# Patient Record
Sex: Male | Born: 1935 | Race: Black or African American | Hispanic: No | Marital: Married
Health system: Southern US, Community
[De-identification: ages and names within clinical notes are randomized; demographics above are authoritative.]

## PROBLEM LIST (undated history)

## (undated) DIAGNOSIS — K922 Gastrointestinal hemorrhage, unspecified: Secondary | ICD-10-CM

## (undated) DIAGNOSIS — E119 Type 2 diabetes mellitus without complications: Secondary | ICD-10-CM

## (undated) DIAGNOSIS — K219 Gastro-esophageal reflux disease without esophagitis: Secondary | ICD-10-CM

## (undated) DIAGNOSIS — H409 Unspecified glaucoma: Secondary | ICD-10-CM

---

## 2013-04-14 ENCOUNTER — Emergency Department (HOSPITAL_COMMUNITY): Payer: Medicare (Managed Care)

## 2013-04-14 ENCOUNTER — Encounter (HOSPITAL_COMMUNITY): Payer: Self-pay

## 2013-04-14 ENCOUNTER — Emergency Department (HOSPITAL_COMMUNITY)
Admission: EM | Admit: 2013-04-14 | Discharge: 2013-04-14 | Disposition: A | Discharge status: Home / Self Care | Payer: Medicare (Managed Care) | Attending: Emergency Medicine | Admitting: Emergency Medicine

## 2013-04-14 DIAGNOSIS — X500XXA Overexertion from strenuous movement or load, initial encounter: Secondary | ICD-10-CM | POA: Insufficient documentation

## 2013-04-14 DIAGNOSIS — Z23 Encounter for immunization: Secondary | ICD-10-CM | POA: Insufficient documentation

## 2013-04-14 DIAGNOSIS — Y9301 Activity, walking, marching and hiking: Secondary | ICD-10-CM | POA: Insufficient documentation

## 2013-04-14 DIAGNOSIS — IMO0002 Reserved for concepts with insufficient information to code with codable children: Secondary | ICD-10-CM | POA: Insufficient documentation

## 2013-04-14 DIAGNOSIS — S61209A Unspecified open wound of unspecified finger without damage to nail, initial encounter: Secondary | ICD-10-CM | POA: Insufficient documentation

## 2013-04-14 DIAGNOSIS — T148XXA Other injury of unspecified body region, initial encounter: Secondary | ICD-10-CM

## 2013-04-14 DIAGNOSIS — Y929 Unspecified place or not applicable: Secondary | ICD-10-CM | POA: Insufficient documentation

## 2013-04-14 HISTORY — DX: Unspecified glaucoma: H40.9

## 2013-04-14 HISTORY — DX: Type 2 diabetes mellitus without complications: E11.9

## 2013-04-14 LAB — CBC
HCT: 42.9 % (ref 39.0–52.0)
Hemoglobin: 15.2 g/dL (ref 13.0–17.0)
RBC: 4.88 MIL/uL (ref 4.22–5.81)

## 2013-04-14 LAB — COMPREHENSIVE METABOLIC PANEL
ALT: 13 U/L (ref 0–53)
Alkaline Phosphatase: 97 U/L (ref 39–117)
BUN: 30 mg/dL — ABNORMAL HIGH (ref 6–23)
CO2: 21 mEq/L (ref 19–32)
Chloride: 107 mEq/L (ref 96–112)
GFR calc Af Amer: 42 mL/min — ABNORMAL LOW (ref >90)
GFR calc non Af Amer: 36 mL/min — ABNORMAL LOW (ref >90)
Glucose, Bld: 246 mg/dL — ABNORMAL HIGH (ref 70–99)
Potassium: 4.4 mEq/L (ref 3.5–5.1)
Sodium: 141 mEq/L (ref 135–145)
Total Bilirubin: 0.2 mg/dL — ABNORMAL LOW (ref 0.3–1.2)

## 2013-04-14 MED ORDER — DOXYCYCLINE HYCLATE 100 MG PO CAPS: 100.0000 mg | ORAL_CAPSULE | Freq: Two times a day (BID) | ORAL | Status: DC

## 2013-04-14 MED ORDER — TETANUS-DIPHTH-ACELL PERTUSSIS 5-2.5-18.5 LF-MCG/0.5 IM SUSP
0.5000 mL | Freq: Once | INTRAMUSCULAR | Status: AC
  Administered 2013-04-14: 0.5 mL via INTRAMUSCULAR
  Filled 2013-04-14: qty 0.5

## 2013-04-14 NOTE — ED Provider Notes (Signed)
CSN: [id number]     Arrival date & time 04/14/13  1946 History     First MD Initiated Contact with Patient 04/14/13 2040     Chief Complaint  Patient presents with  . Fall   (Consider location/radiation/quality/duration/timing/severity/associated sxs/prior Treatment) HPI Comments: Patient presents to the ER for evaluation of fall. Patient reports that he was walking, lost his balance and fell. He does not think he hit his head. There was no loss of consciousness. Patient denies chest pain, shortness of breath abdominal pain. He does not have any pain. He reports an abrasion on his left knee and a laceration of his left finger. Pain is mild.  Patient is a 79 y.o. male presenting with fall.  Fall    No past medical history on file. No past surgical history on file. No family history on file. History  Substance Use Topics  . Smoking status: Not on file  . Smokeless tobacco: Not on file  . Alcohol Use: Not on file    Review of Systems  Skin: Positive for wound.  All other systems reviewed and are negative.    Allergies  Review of patient's allergies indicates not on file.  Home Medications  No current outpatient prescriptions on file. BP 127/80  Temp(Src) 98.4 F (36.9 C) (Oral)  Resp 18  SpO2 95% Physical Exam  Constitutional: He is oriented to person, place, and time. He appears well-developed and well-nourished. No distress.  HENT:  Head: Normocephalic and atraumatic.  Right Ear: Hearing normal.  Left Ear: Hearing normal.  Nose: Nose normal.  Mouth/Throat: Oropharynx is clear and moist and mucous membranes are normal.  Eyes: Conjunctivae and EOM are normal. Pupils are equal, round, and reactive to light.  Neck: Normal range of motion. Neck supple.  Cardiovascular: Regular rhythm, S1 normal and S2 normal.  Exam reveals no gallop and no friction rub.   No murmur heard. Pulmonary/Chest: Effort normal and breath sounds normal. No respiratory distress. He exhibits no  tenderness.  Abdominal: Soft. Normal appearance and bowel sounds are normal. There is no hepatosplenomegaly. There is no tenderness. There is no rebound, no guarding, no tenderness at McBurney's point and negative Murphy's sign. No hernia.  Musculoskeletal: Normal range of motion.       Right hip: Normal.       Left hip: Normal.       Left knee: He exhibits normal range of motion, no swelling, no effusion, no ecchymosis, no deformity, no laceration and no erythema.       Cervical back: Normal.       Thoracic back: Normal.       Lumbar back: Normal.  Neurological: He is alert and oriented to person, place, and time. He has normal strength. No cranial nerve deficit or sensory deficit. Coordination normal. GCS eye subscore is 4. GCS verbal subscore is 5. GCS motor subscore is 6.  Skin: Skin is warm, dry and intact. No rash noted. No cyanosis.     Psychiatric: He has a normal mood and affect. His speech is normal and behavior is normal. Thought content normal.    ED Course   Procedures (including critical care time)  EKG:  Date: 04/14/2013  Rate: 111  Rhythm: sinus tachycardia and premature atrial contractions (PAC)  QRS Axis: normal  Intervals: normal  ST/T Wave abnormalities: normal  Conduction Disutrbances:right bundle branch block  Narrative Interpretation:   Old EKG Reviewed: none available    Labs Reviewed  COMPREHENSIVE METABOLIC PANEL - Abnormal; Notable  for the following:    Glucose, Bld 246 (*)    BUN 30 (*)    Creatinine, Ser 1.63 (*)    Albumin 3.4 (*)    Total Bilirubin 0.2 (*)    GFR calc non Af Amer 36 (*)    GFR calc Af Amer 42 (*)    All other components within normal limits  CBC  URINALYSIS, ROUTINE W REFLEX MICROSCOPIC   Ct Head Wo Contrast  04/14/2013   *RADIOLOGY REPORT*  Clinical Data: Recent traumatic injury.  CT HEAD WITHOUT CONTRAST  Technique:  Contiguous axial images were obtained from the base of the skull through the vertex without contrast.   Comparison: None.  Findings: The bony calvarium is intact.  Diffuse atrophic changes are identified.  Chronic white matter ischemic change is seen.  No acute hemorrhage, acute infarction or space-occupying mass lesion is seen.  IMPRESSION: Chronic changes without acute abnormality.   Original Report Authenticated By: Alcide Clever, M.D.   Dg Knee Complete 4 Views Left  04/14/2013   *RADIOLOGY REPORT*  Clinical Data: Fall.  Laceration left knee.  LEFT KNEE - COMPLETE 4+ VIEW  Comparison: None.  Findings: No fracture, dislocation or radiopaque foreign body is identified.  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  IMPRESSION: No acute abnormality.   Original Report Authenticated By: Holley Dexter, M.D.   Dg Finger Index Left  04/14/2013   *RADIOLOGY REPORT*  Clinical Data: Status post fall.  Laceration.  LEFT INDEX FINGER 2+V  Comparison: None.  Findings: No fracture or dislocation is identified.  Two punctate radiopaque densities in the volar subcutaneous tissues may be foreign bodies.  IMPRESSION:  1.  Negative for fracture. 2.  Question two punctate foreign bodies in the subcutaneous tissues of the volar index finger.   Original Report Authenticated By: Holley Dexter, M.D.   Diagnoses: 1. Skin avulsion left index finger 2. Abrasion left knee  MDM  Presents to the ER after a fall. Patient has a large avulsion of skin off of the volar aspect of the left index finger. X-ray was negative. He has a small abrasion on his left knee, x-ray was negative. Head CT unremarkable. No concern for neck or back injury based on exam. Patient was mildly tachycardic on arrival. This resolved without intervention. EKG showed sinus rhythm with PACs.    Gilda Crease, MD 04/14/13 [phone]

## 2013-04-14 NOTE — ED Notes (Signed)
Pt was walking and fell. C/o LAC to pointer finger and abrasion on left knee

## 2013-08-30 ENCOUNTER — Emergency Department (HOSPITAL_COMMUNITY)
Admission: EM | Admit: 2013-08-30 | Discharge: 2013-08-30 | Disposition: A | Discharge status: Home / Self Care | Payer: Medicaid - Out of State | Attending: Emergency Medicine | Admitting: Emergency Medicine

## 2013-08-30 ENCOUNTER — Encounter (HOSPITAL_COMMUNITY): Payer: Self-pay | Admitting: Emergency Medicine

## 2013-08-30 ENCOUNTER — Emergency Department (HOSPITAL_COMMUNITY): Payer: Medicaid - Out of State

## 2013-08-30 DIAGNOSIS — R296 Repeated falls: Secondary | ICD-10-CM | POA: Insufficient documentation

## 2013-08-30 DIAGNOSIS — S61214A Laceration without foreign body of right ring finger without damage to nail, initial encounter: Secondary | ICD-10-CM

## 2013-08-30 DIAGNOSIS — Y9301 Activity, walking, marching and hiking: Secondary | ICD-10-CM | POA: Insufficient documentation

## 2013-08-30 DIAGNOSIS — Z87891 Personal history of nicotine dependence: Secondary | ICD-10-CM | POA: Insufficient documentation

## 2013-08-30 DIAGNOSIS — Y9289 Other specified places as the place of occurrence of the external cause: Secondary | ICD-10-CM | POA: Insufficient documentation

## 2013-08-30 DIAGNOSIS — S61209A Unspecified open wound of unspecified finger without damage to nail, initial encounter: Secondary | ICD-10-CM | POA: Insufficient documentation

## 2013-08-30 DIAGNOSIS — Z8669 Personal history of other diseases of the nervous system and sense organs: Secondary | ICD-10-CM | POA: Insufficient documentation

## 2013-08-30 DIAGNOSIS — S6990XA Unspecified injury of unspecified wrist, hand and finger(s), initial encounter: Secondary | ICD-10-CM | POA: Insufficient documentation

## 2013-08-30 DIAGNOSIS — E119 Type 2 diabetes mellitus without complications: Secondary | ICD-10-CM | POA: Insufficient documentation

## 2013-08-30 DIAGNOSIS — S61212A Laceration without foreign body of right middle finger without damage to nail, initial encounter: Secondary | ICD-10-CM

## 2013-08-30 NOTE — ED Notes (Signed)
Patient walked onto the porch by himself and fell off of the porch and was in the bushes upside down. Patient has torn skin right hand. Bleeding controlled.

## 2013-08-30 NOTE — ED Provider Notes (Signed)
CSN: [id number]     Arrival date & time 08/30/13  1409 History   First MD Initiated Contact with Patient 08/30/13 1507     Chief Complaint  Patient presents with  . Fall  . Hand Injury    HPI Patient reports he was walking off a porch and got down to ground level when he lost his balance and fell towards the right landing in a bush.  He reports lacerations to his right index and middle finger.  He denies loss consciousness.  He denies head injury.  No neck pain.  No weakness of his upper lower extremities.  No chest pain or shortness of breath.  No abdominal pain nausea vomiting or diarrhea.  No recent illness.  Overall his normal state of health over the past several days.  No preceding palpitations.  No loss consciousness.  No syncope.  Tetanus updated less than 5 years ago.  Pain in his right hand is mild.  Lacerations over his right middle right index finger present.  Normal flexion.   Past Medical History  Diagnosis Date  . Diabetes mellitus without complication   . Glaucoma    History reviewed. No pertinent past surgical history. History reviewed. No pertinent family history. History  Substance Use Topics  . Smoking status: Former Games developer  . Smokeless tobacco: Never Used  . Alcohol Use: No    Review of Systems  All other systems reviewed and are negative.    Allergies  Review of patient's allergies indicates no known allergies.  Home Medications   Current Outpatient Rx  Name  Route  Sig  Dispense  Refill  . Multiple Vitamins-Minerals (MULTIVITAMIN WITH MINERALS) tablet   Oral   Take 1 tablet by mouth daily.          BP 130/76  Pulse 88  Temp(Src) 98.7 F (37.1 C) (Oral)  Resp 18  Ht 6\' 2"  (1.88 m)  Wt 220 lb (99.791 kg)  BMI 28.23 kg/m2  SpO2 98% Physical Exam  Nursing note and vitals reviewed. Constitutional: He is oriented to person, place, and time. He appears well-developed and well-nourished.  HENT:  Head: Normocephalic and atraumatic.  Eyes: EOM  are normal.  Neck: Normal range of motion.  C-spine nontender.  C-spine cleared by Nexus criteria.  Cardiovascular: Normal rate, regular rhythm, normal heart sounds and intact distal pulses.   Pulmonary/Chest: Effort normal and breath sounds normal. No respiratory distress.  Abdominal: Soft. He exhibits no distension. There is no tenderness.  Musculoskeletal: Normal range of motion.  Full range of motion of bilateral ankles knees and hips.  Full range of motion of bilateral wrists elbows and shoulders.  No thoracic or lumbar tenderness.  Avulsion lacerations overlying the right middle and index fingers.  These are avulsion type lacerations overlying the PIP joints of both of these fingers.  Normal flexion in both his right middle and right index finger.  No active bleeding.  Neurological: He is alert and oriented to person, place, and time.  Skin: Skin is warm and dry.  Psychiatric: He has a normal mood and affect. Judgment normal.    ED Course  Procedures (including critical care time) LACERATION REPAIR Performed by: Lyanne Co Consent: Verbal consent obtained. Risks and benefits: risks, benefits and alternatives were discussed Patient identity confirmed: provided demographic data Time out performed prior to procedure Prepped and Draped in normal sterile fashion Wound explored Laceration Location: Right middle finger, polar surface Laceration Length: 2 cm No Foreign Bodies seen or palpated  Anesthesia: local infiltration Local anesthetic: lidocaine 2 % with epinephrine Anesthetic total: 4 ml Irrigation method: syringe Amount of cleaning: standard Skin closure: 4-0 Prolene  Number of sutures or staples: 5  Technique: Simple interrupted  Patient tolerance: Patient tolerated the procedure well with no immediate complications.  LACERATION REPAIR Performed by: Lyanne Co Consent: Verbal consent obtained. Risks and benefits: risks, benefits and alternatives were  discussed Patient identity confirmed: provided demographic data Time out performed prior to procedure Prepped and Draped in normal sterile fashion Wound explored Laceration Location: Right index finger Laceration Length: 1.5 cm No Foreign Bodies seen or palpated Anesthesia: local infiltration Local anesthetic: lidocaine 2 % with epinephrine Anesthetic total: 3 ml Irrigation method: syringe Amount of cleaning: standard Skin closure: 4-0 Prolene  Number of sutures or staples: 2  Technique: Simple interrupted  Patient tolerance: Patient tolerated the procedure well with no immediate complications.    Labs Review Labs Reviewed - No data to display Imaging Review Dg Hand Complete Right  08/30/2013   CLINICAL DATA:  Fall with lacerations on right 2nd, 3rd, and 4th digits.  EXAM: RIGHT HAND - COMPLETE 3+ VIEW  COMPARISON:  None.  FINDINGS: There is no definite evidence of acute fracture. There is no dislocation. Bones are osteopenic. Mild joint space narrowing and spurring are present at multiple interphalangeal joints, most prominently at the ring finger DIP joint and thumb IP joint. Osteoarthrosis is also noted at the 1st MCP and 1st CMC joints. No radiopaque foreign body is identified.  IMPRESSION: No acute fracture or dislocation identified.  Osteoarthrosis.   Electronically Signed   By: Sebastian Ache   On: 08/30/2013 15:51  I personally reviewed the imaging tests through PACS system I reviewed available ER/hospitalization records through the EMR   EKG Interpretation   None       MDM   1. Laceration of right middle finger w/o foreign body w/o damage to nail, initial encounter   2. Laceration of right ring finger w/o foreign body w/o damage to nail, initial encounter    Lacerations repaired.  C-spine cleared by Nexus criteria.  Full range of motion bilateral hips.  Full range of motion bilateral wrists elbows and shoulders.  Discharge home in good condition.  Infection warnings  given.  No indication for CT head.    Lyanne Co, MD 08/30/13 [phone]

## 2015-03-07 ENCOUNTER — Emergency Department (HOSPITAL_COMMUNITY)
Admission: EM | Admit: 2015-03-07 | Discharge: 2015-03-07 | Disposition: A | Discharge status: Home / Self Care | Payer: Medicare HMO | Attending: Emergency Medicine | Admitting: Emergency Medicine

## 2015-03-07 ENCOUNTER — Emergency Department (HOSPITAL_COMMUNITY): Payer: Medicare HMO

## 2015-03-07 ENCOUNTER — Encounter (HOSPITAL_COMMUNITY): Payer: Self-pay | Admitting: Emergency Medicine

## 2015-03-07 DIAGNOSIS — W06XXXA Fall from bed, initial encounter: Secondary | ICD-10-CM | POA: Diagnosis not present

## 2015-03-07 DIAGNOSIS — Y929 Unspecified place or not applicable: Secondary | ICD-10-CM | POA: Insufficient documentation

## 2015-03-07 DIAGNOSIS — S0181XA Laceration without foreign body of other part of head, initial encounter: Secondary | ICD-10-CM | POA: Diagnosis not present

## 2015-03-07 DIAGNOSIS — Y999 Unspecified external cause status: Secondary | ICD-10-CM | POA: Diagnosis not present

## 2015-03-07 DIAGNOSIS — E119 Type 2 diabetes mellitus without complications: Secondary | ICD-10-CM | POA: Diagnosis not present

## 2015-03-07 DIAGNOSIS — Z23 Encounter for immunization: Secondary | ICD-10-CM | POA: Diagnosis not present

## 2015-03-07 DIAGNOSIS — S0990XA Unspecified injury of head, initial encounter: Secondary | ICD-10-CM | POA: Insufficient documentation

## 2015-03-07 DIAGNOSIS — S0993XA Unspecified injury of face, initial encounter: Secondary | ICD-10-CM | POA: Diagnosis present

## 2015-03-07 DIAGNOSIS — Z79899 Other long term (current) drug therapy: Secondary | ICD-10-CM | POA: Diagnosis not present

## 2015-03-07 DIAGNOSIS — H409 Unspecified glaucoma: Secondary | ICD-10-CM | POA: Insufficient documentation

## 2015-03-07 DIAGNOSIS — Z87891 Personal history of nicotine dependence: Secondary | ICD-10-CM | POA: Diagnosis not present

## 2015-03-07 DIAGNOSIS — Y939 Activity, unspecified: Secondary | ICD-10-CM | POA: Diagnosis not present

## 2015-03-07 MED ORDER — TETANUS-DIPHTH-ACELL PERTUSSIS 5-2.5-18.5 LF-MCG/0.5 IM SUSP
0.5000 mL | Freq: Once | INTRAMUSCULAR | Status: AC
  Administered 2015-03-07: 0.5 mL via INTRAMUSCULAR
  Filled 2015-03-07: qty 0.5

## 2015-03-07 NOTE — ED Notes (Signed)
Awake. Verbally responsive. A/O x4. Resp even and unlabored. No audible adventitious breath sounds noted. ABC's intact. Pt was taken to CT scan and returned without distress noted.

## 2015-03-07 NOTE — ED Notes (Signed)
Awake. Verbally responsive. A/O x4. Resp even and unlabored. No audible adventitious breath sounds noted. ABC's intact.  

## 2015-03-07 NOTE — Discharge Instructions (Signed)
Facial Laceration ° A facial laceration is a cut on the face. These injuries can be painful and cause bleeding. Lacerations usually heal quickly, but they need special care to reduce scarring. °DIAGNOSIS  °Your health care provider will take a medical history, ask for details about how the injury occurred, and examine the wound to determine how deep the cut is. °TREATMENT  °Some facial lacerations may not require closure. Others may not be able to be closed because of an increased risk of infection. The risk of infection and the chance for successful closure will depend on various factors, including the amount of time since the injury occurred. °The wound may be cleaned to help prevent infection. If closure is appropriate, pain medicines may be given if needed. Your health care provider will use stitches (sutures), wound glue (adhesive), or skin adhesive strips to repair the laceration. These tools bring the skin edges together to allow for faster healing and a better cosmetic outcome. If needed, you may also be given a tetanus shot. °HOME CARE INSTRUCTIONS °· Only take over-the-counter or prescription medicines as directed by your health care provider. °· Follow your health care provider's instructions for wound care. These instructions will vary depending on the technique used for closing the wound. °For Sutures: °· Keep the wound clean and dry.   °· If you were given a bandage (dressing), you should change it at least once a day. Also change the dressing if it becomes wet or dirty, or as directed by your health care provider.   °· Wash the wound with soap and water 2 times a day. Rinse the wound off with water to remove all soap. Pat the wound dry with a clean towel.   °· After cleaning, apply a thin layer of the antibiotic ointment recommended by your health care provider. This will help prevent infection and keep the dressing from sticking.   °· You may shower as usual after the first 24 hours. Do not soak the  wound in water until the sutures are removed.   °· Get your sutures removed as directed by your health care provider. With facial lacerations, sutures should usually be taken out after 4-5 days to avoid stitch marks.   °· Wait a few days after your sutures are removed before applying any makeup. °For Skin Adhesive Strips: °· Keep the wound clean and dry.   °· Do not get the skin adhesive strips wet. You may bathe carefully, using caution to keep the wound dry.   °· If the wound gets wet, pat it dry with a clean towel.   °· Skin adhesive strips will fall off on their own. You may trim the strips as the wound heals. Do not remove skin adhesive strips that are still stuck to the wound. They will fall off in time.   °For Wound Adhesive: °· You may briefly wet your wound in the shower or bath. Do not soak or scrub the wound. Do not swim. Avoid periods of heavy sweating until the skin adhesive has fallen off on its own. After showering or bathing, gently pat the wound dry with a clean towel.   °· Do not apply liquid medicine, cream medicine, ointment medicine, or makeup to your wound while the skin adhesive is in place. This may loosen the film before your wound is healed.   °· If a dressing is placed over the wound, be careful not to apply tape directly over the skin adhesive. This may cause the adhesive to be pulled off before the wound is healed.   °· Avoid   prolonged exposure to sunlight or tanning lamps while the skin adhesive is in place. °· The skin adhesive will usually remain in place for 5-10 days, then naturally fall off the skin. Do not pick at the adhesive film.   °After Healing: °Once the wound has healed, cover the wound with sunscreen during the day for 1 full year. This can help minimize scarring. Exposure to ultraviolet light in the first year will darken the scar. It can take 1-2 years for the scar to lose its redness and to heal completely.  °SEEK IMMEDIATE MEDICAL CARE IF: °· You have redness, pain, or  swelling around the wound.   °· You see a yellowish-white fluid (pus) coming from the wound.   °· You have chills or a fever.   °MAKE SURE YOU: °· Understand these instructions. °· Will watch your condition. °· Will get help right away if you are not doing well or get worse. °Document Released: 10/06/2004 Document Revised: 06/19/2013 Document Reviewed: 04/11/2013 °ExitCare® Patient Information ©2015 [employer]. This information is not intended to replace advice given to you by your health care provider. Make sure you discuss any questions you have with your health care provider. °Head Injury °You have received a head injury. It does not appear serious at this time. Headaches and vomiting are common following head injury. It should be easy to awaken from sleeping. Sometimes it is necessary for you to stay in the emergency department for a while for observation. Sometimes admission to the hospital may be needed. After injuries such as yours, most problems occur within the first 24 hours, but side effects may occur up to 7-10 days after the injury. It is important for you to carefully monitor your condition and contact your health care provider or seek immediate medical care if there is a change in your condition. °WHAT ARE THE TYPES OF HEAD INJURIES? °Head injuries can be as minor as a bump. Some head injuries can be more severe. More severe head injuries include: °· A jarring injury to the brain (concussion). °· A bruise of the brain (contusion). This mean there is bleeding in the brain that can cause swelling. °· A cracked skull (skull fracture). °· Bleeding in the brain that collects, clots, and forms a bump (hematoma). °WHAT CAUSES A HEAD INJURY? °A serious head injury is most likely to happen to someone who is in a car wreck and is not wearing a seat belt. Other causes of major head injuries include bicycle or motorcycle accidents, sports injuries, and falls. °HOW ARE HEAD INJURIES DIAGNOSED? °A complete  history of the event leading to the injury and your current symptoms will be helpful in diagnosing head injuries. Many times, pictures of the brain, such as CT or MRI are needed to see the extent of the injury. Often, an overnight hospital stay is necessary for observation.  °WHEN SHOULD I SEEK IMMEDIATE MEDICAL CARE?  °You should get help right away if: °· You have confusion or drowsiness. °· You feel sick to your stomach (nauseous) or have continued, forceful vomiting. °· You have dizziness or unsteadiness that is getting worse. °· You have severe, continued headaches not relieved by medicine. Only take over-the-counter or prescription medicines for pain, fever, or discomfort as directed by your health care provider. °· You do not have normal function of the arms or legs or are unable to walk. °· You notice changes in the black spots in the center of the colored part of your eye (pupil). °· You have a clear or   bloody fluid coming from your nose or ears. °· You have a loss of vision. °During the next 24 hours after the injury, you must stay with someone who can watch you for the warning signs. This person should contact local emergency services (911 in the [place] if you have seizures, you become unconscious, or you are unable to wake up. °HOW CAN I PREVENT A HEAD INJURY IN THE FUTURE? °The most important factor for preventing major head injuries is avoiding motor vehicle accidents.  To minimize the potential for damage to your head, it is crucial to wear seat belts while riding in motor vehicles. Wearing helmets while bike riding and playing collision sports (like football) is also helpful. Also, avoiding dangerous activities around the house will further help reduce your risk of head injury.  °WHEN CAN I RETURN TO NORMAL ACTIVITIES AND ATHLETICS? °You should be reevaluated by your health care provider before returning to these activities. If you have any of the following symptoms, you should not return to  activities or contact sports until 1 week after the symptoms have stopped: °· Persistent headache. °· Dizziness or vertigo. °· Poor attention and concentration. °· Confusion. °· Memory problems. °· Nausea or vomiting. °· Fatigue or tire easily. °· Irritability. °· Intolerant of bright lights or loud noises. °· Anxiety or depression. °· Disturbed sleep. °MAKE SURE YOU:  °· Understand these instructions. °· Will watch your condition. °· Will get help right away if you are not doing well or get worse. °Document Released: 08/29/2005 Document Revised: 09/03/2013 Document Reviewed: 05/06/2013 °ExitCare® Patient Information ©2015 [employer]. This information is not intended to replace advice given to you by your health care provider. Make sure you discuss any questions you have with your health care provider. ° °

## 2015-03-07 NOTE — ED Notes (Signed)
Pt hard of hearing. Family reports they heard him call out at 0500. Family came in to check on pt and found him on the floor beside his bed. Pt had a laceration above R eyebrow. At breakfast at 0730 pt began to have a nose bleed which resolved prior to 1100. Pt states his head hurts. No obvious deformity. Not on blood thinners.

## 2015-03-07 NOTE — ED Notes (Signed)
Granddaughter reported that pt rolled out of bed this morning and hit his head on the floor causing lac to center of fons. (-)LOC, dizziness/lightheadedness or visual disturbances and pt ambulated to another room with steady gait. Family reported that later pt started having bloody nasal drainage. Bleeding controlled at this time. Family reported that when pt blows nose noted nasal bleeding. Denies pt taking blood thinners medication.

## 2015-03-07 NOTE — ED Provider Notes (Signed)
TIME SEEN: 1:30 PM  CHIEF COMPLAINT:  Fall, head injury  HPI: Pt is a 81 y.o. M with history of non-insulin-dependent diabetes, glaucoma who presents the emergency department with his granddaughter after he fell while turning out of bed this morning. She states that he had a small laceration above the right eyebrow. She knows that he did have some intermittent nose bleeding today. This occurred around 5:30 this morning. States he has been eating normally, ambulating normally and acting normally. He is not on anticoagulation. Has no complaints of pain. She states that given his intermittent nose bleeding and she was concerned and brought him to the hospital. Nose is not currently bleeding. She states she is only noticed it when he has been blowing his nose.  ROS: See HPI Constitutional: no fever  Eyes: no drainage  ENT: no runny nose   Cardiovascular:  no chest pain  Resp: no SOB  GI: no vomiting GU: no dysuria Integumentary: no rash  Allergy: no hives  Musculoskeletal: no leg swelling  Neurological: no slurred speech ROS otherwise negative  PAST MEDICAL HISTORY/PAST SURGICAL HISTORY:  Past Medical History  Diagnosis Date  . Diabetes mellitus without complication   . Glaucoma     MEDICATIONS:  Prior to Admission medications   Medication Sig Start Date End Date Taking? Authorizing Provider  Multiple Vitamins-Minerals (MULTIVITAMIN WITH MINERALS) tablet Take 1 tablet by mouth daily.    Historical Provider, MD    ALLERGIES:  No Known Allergies  SOCIAL HISTORY:  History  Substance Use Topics  . Smoking status: Former Games developer  . Smokeless tobacco: Never Used  . Alcohol Use: No    FAMILY HISTORY: History reviewed. No pertinent family history.  EXAM: BP 127/62 mmHg  Pulse 77  Temp(Src) 97.5 F (36.4 C) (Oral)  Resp 16  SpO2 100% CONSTITUTIONAL: Alert and oriented and responds appropriately to questions. Well-appearing; well-nourished; GCS 15, elderly, hard of hearing but  oriented and able to answer questions or follow commands HEAD: Normocephalic; 2 cm superficial laceration above the right eyebrow that does not need repair EYES: Conjunctivae clear, PERRL, EOMI ENT: normal nose; no rhinorrhea; moist mucous membranes; pharynx without lesions noted; no dental injury; no septal hematoma, dried blood in bilateral nostrils with no active bleeding, no blood in the posterior oropharynx NECK: Supple, no meningismus, no LAD; no midline spinal tenderness, step-off or deformity CARD: RRR; S1 and S2 appreciated; no murmurs, no clicks, no rubs, no gallops RESP: Normal chest excursion without splinting or tachypnea; breath sounds clear and equal bilaterally; no wheezes, no rhonchi, no rales; no hypoxia or respiratory distress CHEST:  chest wall stable, no crepitus or ecchymosis or deformity, nontender to palpation ABD/GI: Normal bowel sounds; non-distended; soft, non-tender, no rebound, no guarding PELVIS:  stable, nontender to palpation BACK:  The back appears normal and is non-tender to palpation, there is no CVA tenderness; no midline spinal tenderness, step-off or deformity EXT: Normal ROM in all joints; non-tender to palpation; no edema; normal capillary refill; no cyanosis, no bony tenderness or bony deformity of patient's extremities, no joint effusion, no ecchymosis or lacerations    SKIN: Normal color for age and race; warm NEURO: Moves all extremities equally, sensation to light touch intact diffusely, cranial nerves II through XII intact PSYCH: The patient's mood and manner are appropriate. Grooming and personal hygiene are appropriate.  MEDICAL DECISION MAKING: Patient here with fall out of bed. His laceration above his right eyebrow that does not really appear. Updated patient's tetanus vaccination.  CT of his head, cervical spine and fascia no acute abnormality. No active epistaxis currently, no septal hematoma. I do not feel he needs nasal packing at this time. He is  at his neurologic baseline, hemodynamic is stable. I feel he is safe to go home with his granddaughter. Discussed head injury return precautions. Patient and family verbalize understanding and are comfortable with plan.     Layla Maw Tymika Grilli, DO 03/07/15 1549

## 2015-03-07 NOTE — ED Notes (Signed)
Awake. Verbally responsive. A/O x4. Resp even and unlabored. No audible adventitious breath sounds noted. ABC's intact. Family at bedside. 

## 2015-05-24 ENCOUNTER — Inpatient Hospital Stay (HOSPITAL_COMMUNITY): Payer: Medicare HMO

## 2015-05-24 ENCOUNTER — Encounter (HOSPITAL_COMMUNITY): Payer: Self-pay | Admitting: Oncology

## 2015-05-24 ENCOUNTER — Inpatient Hospital Stay (HOSPITAL_COMMUNITY)
Admission: EM | Admit: 2015-05-24 | Discharge: 2015-05-28 | DRG: 853 | Disposition: A | Discharge status: Hospice | Payer: Medicare HMO | Attending: Internal Medicine | Admitting: Internal Medicine

## 2015-05-24 DIAGNOSIS — B964 Proteus (mirabilis) (morganii) as the cause of diseases classified elsewhere: Secondary | ICD-10-CM | POA: Diagnosis present

## 2015-05-24 DIAGNOSIS — Z87891 Personal history of nicotine dependence: Secondary | ICD-10-CM

## 2015-05-24 DIAGNOSIS — G9340 Encephalopathy, unspecified: Secondary | ICD-10-CM | POA: Diagnosis not present

## 2015-05-24 DIAGNOSIS — R197 Diarrhea, unspecified: Secondary | ICD-10-CM | POA: Diagnosis present

## 2015-05-24 DIAGNOSIS — R531 Weakness: Secondary | ICD-10-CM | POA: Diagnosis not present

## 2015-05-24 DIAGNOSIS — N183 Chronic kidney disease, stage 3 unspecified: Secondary | ICD-10-CM | POA: Diagnosis present

## 2015-05-24 DIAGNOSIS — K5660 Unspecified intestinal obstruction: Secondary | ICD-10-CM | POA: Diagnosis present

## 2015-05-24 DIAGNOSIS — K56609 Unspecified intestinal obstruction, unspecified as to partial versus complete obstruction: Secondary | ICD-10-CM

## 2015-05-24 DIAGNOSIS — K529 Noninfective gastroenteritis and colitis, unspecified: Secondary | ICD-10-CM | POA: Diagnosis present

## 2015-05-24 DIAGNOSIS — J9811 Atelectasis: Secondary | ICD-10-CM | POA: Diagnosis present

## 2015-05-24 DIAGNOSIS — W19XXXA Unspecified fall, initial encounter: Secondary | ICD-10-CM | POA: Diagnosis present

## 2015-05-24 DIAGNOSIS — N179 Acute kidney failure, unspecified: Secondary | ICD-10-CM | POA: Diagnosis not present

## 2015-05-24 DIAGNOSIS — J939 Pneumothorax, unspecified: Secondary | ICD-10-CM | POA: Diagnosis present

## 2015-05-24 DIAGNOSIS — R627 Adult failure to thrive: Secondary | ICD-10-CM | POA: Diagnosis present

## 2015-05-24 DIAGNOSIS — E119 Type 2 diabetes mellitus without complications: Secondary | ICD-10-CM

## 2015-05-24 DIAGNOSIS — S61412A Laceration without foreign body of left hand, initial encounter: Secondary | ICD-10-CM | POA: Diagnosis present

## 2015-05-24 DIAGNOSIS — D649 Anemia, unspecified: Secondary | ICD-10-CM | POA: Diagnosis present

## 2015-05-24 DIAGNOSIS — N189 Chronic kidney disease, unspecified: Secondary | ICD-10-CM

## 2015-05-24 DIAGNOSIS — R109 Unspecified abdominal pain: Secondary | ICD-10-CM | POA: Diagnosis not present

## 2015-05-24 DIAGNOSIS — E860 Dehydration: Secondary | ICD-10-CM | POA: Diagnosis present

## 2015-05-24 DIAGNOSIS — Z515 Encounter for palliative care: Secondary | ICD-10-CM | POA: Diagnosis not present

## 2015-05-24 DIAGNOSIS — K922 Gastrointestinal hemorrhage, unspecified: Secondary | ICD-10-CM | POA: Diagnosis present

## 2015-05-24 DIAGNOSIS — N390 Urinary tract infection, site not specified: Secondary | ICD-10-CM | POA: Diagnosis present

## 2015-05-24 DIAGNOSIS — A419 Sepsis, unspecified organism: Principal | ICD-10-CM | POA: Diagnosis present

## 2015-05-24 DIAGNOSIS — H409 Unspecified glaucoma: Secondary | ICD-10-CM | POA: Diagnosis present

## 2015-05-24 DIAGNOSIS — Z7189 Other specified counseling: Secondary | ICD-10-CM | POA: Diagnosis not present

## 2015-05-24 DIAGNOSIS — E1122 Type 2 diabetes mellitus with diabetic chronic kidney disease: Secondary | ICD-10-CM | POA: Diagnosis present

## 2015-05-24 DIAGNOSIS — E872 Acidosis: Secondary | ICD-10-CM | POA: Diagnosis not present

## 2015-05-24 DIAGNOSIS — R7989 Other specified abnormal findings of blood chemistry: Secondary | ICD-10-CM | POA: Diagnosis present

## 2015-05-24 DIAGNOSIS — Z66 Do not resuscitate: Secondary | ICD-10-CM | POA: Diagnosis present

## 2015-05-24 LAB — BLOOD GAS, ARTERIAL
Acid-base deficit: 6 mmol/L — ABNORMAL HIGH (ref 0.0–2.0)
Bicarbonate: 17.9 meq/L — ABNORMAL LOW (ref 20.0–24.0)
Drawn by: 422461
FIO2: 1
O2 Content: 15 L/min
O2 Saturation: 93.4 %
Patient temperature: 94.7
TCO2: 16.3 mmol/L (ref 0–100)
pCO2 arterial: 28.8 mmHg — ABNORMAL LOW (ref 35.0–45.0)
pH, Arterial: 7.397 (ref 7.350–7.450)
pO2, Arterial: 66.9 mmHg — ABNORMAL LOW (ref 80.0–100.0)

## 2015-05-24 LAB — URINE MICROSCOPIC-ADD ON

## 2015-05-24 LAB — COMPREHENSIVE METABOLIC PANEL
ALBUMIN: 3.1 g/dL — AB (ref 3.5–5.0)
ALK PHOS: 91 U/L (ref 38–126)
ALT: 21 U/L (ref 17–63)
ANION GAP: 14 (ref 5–15)
AST: 34 U/L (ref 15–41)
BILIRUBIN TOTAL: 0.7 mg/dL (ref 0.3–1.2)
BUN: 66 mg/dL — ABNORMAL HIGH (ref 6–20)
CALCIUM: 8.9 mg/dL (ref 8.9–10.3)
CO2: 21 mmol/L — ABNORMAL LOW (ref 22–32)
Chloride: 107 mmol/L (ref 101–111)
Creatinine, Ser: 2.51 mg/dL — ABNORMAL HIGH (ref 0.61–1.24)
GFR, EST AFRICAN AMERICAN: 24 mL/min — AB (ref >60)
GFR, EST NON AFRICAN AMERICAN: 21 mL/min — AB (ref >60)
Glucose, Bld: 245 mg/dL — ABNORMAL HIGH (ref 65–99)
POTASSIUM: 4.1 mmol/L (ref 3.5–5.1)
Sodium: 142 mmol/L (ref 135–145)
TOTAL PROTEIN: 6.6 g/dL (ref 6.5–8.1)

## 2015-05-24 LAB — GLUCOSE, CAPILLARY
Glucose-Capillary: 126 mg/dL — ABNORMAL HIGH (ref 65–99)
Glucose-Capillary: 98 mg/dL (ref 65–99)
Glucose-Capillary: 120 mg/dL — ABNORMAL HIGH (ref 65–99)

## 2015-05-24 LAB — URINALYSIS, ROUTINE W REFLEX MICROSCOPIC
GLUCOSE, UA: NEGATIVE mg/dL
Ketones, ur: NEGATIVE mg/dL
Nitrite: NEGATIVE
PH: 5 (ref 5.0–8.0)
PROTEIN: 100 mg/dL — AB
Specific Gravity, Urine: 1.019 (ref 1.005–1.030)
Urobilinogen, UA: 1 mg/dL (ref 0.0–1.0)

## 2015-05-24 LAB — I-STAT CG4 LACTIC ACID, ED: Lactic Acid, Venous: 7.22 mmol/L (ref 0.5–2.0)

## 2015-05-24 LAB — PROTIME-INR
INR: 1.14 (ref 0.00–1.49)
Prothrombin Time: 14.8 seconds (ref 11.6–15.2)

## 2015-05-24 LAB — CBC WITH DIFFERENTIAL/PLATELET
BASOS PCT: 0 % (ref 0–1)
Basophils Absolute: 0 10*3/uL (ref 0.0–0.1)
EOS PCT: 0 % (ref 0–5)
Eosinophils Absolute: 0 10*3/uL (ref 0.0–0.7)
HCT: 23 % — ABNORMAL LOW (ref 39.0–52.0)
Hemoglobin: 7.6 g/dL — ABNORMAL LOW (ref 13.0–17.0)
LYMPHS ABS: 1 10*3/uL (ref 0.7–4.0)
Lymphocytes Relative: 4 % — ABNORMAL LOW (ref 12–46)
MCH: 29.9 pg (ref 26.0–34.0)
MCHC: 33 g/dL (ref 30.0–36.0)
MCV: 90.6 fL (ref 78.0–100.0)
MONO ABS: 1.3 10*3/uL — AB (ref 0.1–1.0)
Monocytes Relative: 5 % (ref 3–12)
NEUTROS ABS: 23.3 10*3/uL — AB (ref 1.7–7.7)
Neutrophils Relative %: 91 % — ABNORMAL HIGH (ref 43–77)
PLATELETS: 292 10*3/uL (ref 150–400)
RBC: 2.54 MIL/uL — ABNORMAL LOW (ref 4.22–5.81)
RDW: 15.7 % — AB (ref 11.5–15.5)
WBC: 25.6 10*3/uL — ABNORMAL HIGH (ref 4.0–10.5)

## 2015-05-24 LAB — CREATININE, URINE, RANDOM: Creatinine, Urine: 86.8 mg/dL

## 2015-05-24 LAB — ABO/RH: ABO/RH(D): O POS

## 2015-05-24 LAB — TROPONIN I: Troponin I: 0.03 ng/mL (ref <0.031)

## 2015-05-24 LAB — APTT: APTT: 32 s (ref 24–37)

## 2015-05-24 LAB — SODIUM, URINE, RANDOM: SODIUM UR: 100 mmol/L

## 2015-05-24 LAB — PREPARE RBC (CROSSMATCH)

## 2015-05-24 LAB — POC OCCULT BLOOD, ED: Fecal Occult Bld: POSITIVE — AB

## 2015-05-24 MED ORDER — ACETAMINOPHEN 325 MG PO TABS: 650.0000 mg | ORAL_TABLET | Freq: Four times a day (QID) | ORAL | Status: DC | PRN

## 2015-05-24 MED ORDER — IOHEXOL 300 MG/ML  SOLN
50.0000 mL | Freq: Once | INTRAMUSCULAR | Status: AC | PRN
  Administered 2015-05-24: 50 mL via ORAL

## 2015-05-24 MED ORDER — CIPROFLOXACIN IN D5W 400 MG/200ML IV SOLN
400.0000 mg | INTRAVENOUS | Status: AC
  Administered 2015-05-24: 400 mg via INTRAVENOUS
  Filled 2015-05-24: qty 200

## 2015-05-24 MED ORDER — INSULIN ASPART 100 UNIT/ML ~~LOC~~ SOLN
0.0000 [IU] | Freq: Three times a day (TID) | SUBCUTANEOUS | Status: DC
  Administered 2015-05-26: 2 [IU] via SUBCUTANEOUS
  Administered 2015-05-27: 3 [IU] via SUBCUTANEOUS
  Administered 2015-05-27: 2 [IU] via SUBCUTANEOUS
  Administered 2015-05-28: 3 [IU] via SUBCUTANEOUS

## 2015-05-24 MED ORDER — SODIUM CHLORIDE 0.9 % IV SOLN
1000.0000 mL | INTRAVENOUS | Status: DC
  Administered 2015-05-24 – 2015-05-25 (×2): 1000 mL via INTRAVENOUS

## 2015-05-24 MED ORDER — DEXTROSE 5 % IV SOLN
1.0000 g | Freq: Once | INTRAVENOUS | Status: AC
  Administered 2015-05-24: 1 g via INTRAVENOUS
  Filled 2015-05-24: qty 10

## 2015-05-24 MED ORDER — ACETAMINOPHEN 650 MG RE SUPP: 650.0000 mg | Freq: Four times a day (QID) | RECTAL | Status: DC | PRN

## 2015-05-24 MED ORDER — SODIUM CHLORIDE 0.9 % IV SOLN: 1000.0000 mL | INTRAVENOUS | Status: DC

## 2015-05-24 MED ORDER — SODIUM CHLORIDE 0.9 % IV SOLN: Freq: Once | INTRAVENOUS | Status: DC

## 2015-05-24 MED ORDER — SODIUM CHLORIDE 0.9 % IV BOLUS (SEPSIS)
2000.0000 mL | Freq: Once | INTRAVENOUS | Status: AC
Start: 2015-05-24 — End: 2015-05-24
  Administered 2015-05-24: 2000 mL via INTRAVENOUS

## 2015-05-24 MED ORDER — SODIUM CHLORIDE 0.9 % IV SOLN
1000.0000 mL | Freq: Once | INTRAVENOUS | Status: AC
  Administered 2015-05-24: 1000 mL via INTRAVENOUS

## 2015-05-24 MED ORDER — SODIUM CHLORIDE 0.9 % IV SOLN
80.0000 mg | Freq: Once | INTRAVENOUS | Status: AC
  Administered 2015-05-24: 80 mg via INTRAVENOUS
  Filled 2015-05-24: qty 80

## 2015-05-24 MED ORDER — SODIUM CHLORIDE 0.9 % IJ SOLN
3.0000 mL | Freq: Two times a day (BID) | INTRAMUSCULAR | Status: DC
  Administered 2015-05-24 – 2015-05-28 (×7): 3 mL via INTRAVENOUS

## 2015-05-24 MED ORDER — METRONIDAZOLE IN NACL 5-0.79 MG/ML-% IV SOLN
500.0000 mg | Freq: Three times a day (TID) | INTRAVENOUS | Status: DC
  Administered 2015-05-24 – 2015-05-27 (×10): 500 mg via INTRAVENOUS
  Filled 2015-05-24 (×12): qty 100

## 2015-05-24 MED ORDER — HYDROXYZINE HCL 50 MG/ML IM SOLN
25.0000 mg | Freq: Four times a day (QID) | INTRAMUSCULAR | Status: DC | PRN
  Filled 2015-05-24: qty 0.5

## 2015-05-24 MED ORDER — SODIUM CHLORIDE 0.9 % IV SOLN: 10.0000 mL/h | Freq: Once | INTRAVENOUS | Status: DC

## 2015-05-24 MED ORDER — CIPROFLOXACIN IN D5W 400 MG/200ML IV SOLN
400.0000 mg | INTRAVENOUS | Status: DC
  Administered 2015-05-25 – 2015-05-26 (×2): 400 mg via INTRAVENOUS
  Filled 2015-05-24 (×2): qty 200

## 2015-05-24 MED ORDER — MORPHINE SULFATE (PF) 2 MG/ML IV SOLN: 2.0000 mg | INTRAVENOUS | Status: DC | PRN

## 2015-05-24 NOTE — ED Notes (Signed)
Per EMS pt has had poor PO intake, weakness and falls.  Pt is not c/o pain at this time.

## 2015-05-24 NOTE — H&P (Addendum)
[facility] History and Physical  Perkins Molina UUV:[id number] DOB: [birth year] DOA: 05/24/2015  Referring physician: ED physician PCP: Rachell Cipro, MD  Specialists:   Chief Complaint: Diarrhea, abdominal pain and AMS  HPI: Francisco Roberts is a 81 y.o. male with PMH of diet-controlled diabetes, glaucoma,CKD-III, who presents with diarrhea, abdominal pain and AMS.  Patient has AMS and is unable to provide medical history, therefore, most of the history is obtained by discussing the case with ED physician and his granddaughter. It seems that patient started having abdominal pain since the Friday short after he took some [place] food. He did not have nausea, vomiting. He did not have diarrhea until today. He had one large bowel movement with loose stool today. Not sure whether the patient had blood in stool per her granddaughter. Patient feels very weak, and is confused. He still does not have nausea or vomiting. Not sure whether patient has symptoms of a UTI. He moves all extremities. Does not complain of chest pain, shortness of breath and cough.   In ED, patient was found to have hemoglobin dropped from 15.2 on 04/14/13-->7.6, WBC 25.6, lactate 7.22, temperature normal, tachycardia, worsening renal function, positive urinalysis for UTI, positive FOBT. X-ray pending.   Where does patient live?   At home   Can patient participate in ADLs? Barely   Review of Systems: Could not be obtained due to altered mental status.  Allergy: No Known Allergies  Past Medical History  Diagnosis Date  . Diabetes mellitus without complication   . Glaucoma     History reviewed. No pertinent past surgical history.  Social History:  reports that he has quit smoking. He has never used smokeless tobacco. He reports that he does not drink alcohol or use illicit drugs.  Family History: could not be obtained due to altered mental status.  Prior to Admission medications   Medication Sig Start Date End Date Taking?  Authorizing Provider  DM-Phenylephrine-Acetaminophen (TYLENOL COLD HEAD CONGESTION) 10-5-325 MG TABS Take 2 tablets by mouth once.   Yes Historical Provider, MD    Physical Exam: Filed Vitals:   05/24/15 0245 05/24/15 0300 05/24/15 0315 05/24/15 0345  BP: 173/63 157/98 159/70 138/77  Pulse: 78 104  102  Temp:      TempSrc:      Resp: 16 23 17 18   SpO2: 91% 90%  92%   General: Not in acute distress HEENT:       Eyes: PERRL, EOMI, no scleral icterus.       ENT: No discharge from the ears and nose, no pharynx injection, no tonsillar enlargement.        Neck: No JVD, no bruit, no mass felt. Heme: No neck lymph node enlargement. Cardiac: S1/S2, RRR, tachycardia, No murmurs, No gallops or rubs. Pulm: No rales, wheezing, rhonchi or rubs. Abd: Soft, mildly distended, diffused tenderness, no organomegaly, BS present. Ext: No pitting leg edema bilaterally. 2+DP/PT pulse bilaterally. Musculoskeletal: No joint deformities, No joint redness or warmth, no limitation of ROM in spin. Skin: No rashes.  Neuro: confused, not oriented X3, cranial nerves II-XII grossly intact, moves all extremities. Psych: Could not be assessed due to altered mental status  Labs on Admission:  Basic Metabolic Panel:  Recent Labs Lab 05/24/15 0116  NA 142  K 4.1  CL 107  CO2 21*  GLUCOSE 245*  BUN 66*  CREATININE 2.51*  CALCIUM 8.9   Liver Function Tests:  Recent Labs Lab 05/24/15 0116  AST 34  ALT 21  ALKPHOS  91  BILITOT 0.7  PROT 6.6  ALBUMIN 3.1*   No results for input(s): LIPASE, AMYLASE in the last 168 hours. No results for input(s): AMMONIA in the last 168 hours. CBC:  Recent Labs Lab 05/24/15 0116  WBC 25.6*  NEUTROABS 23.3*  HGB 7.6*  HCT 23.0*  MCV 90.6  PLT 292   Cardiac Enzymes:  Recent Labs Lab 05/24/15 0116  TROPONINI 0.03    BNP (last 3 results) No results for input(s): BNP in the last 8760 hours.  ProBNP (last 3 results) No results for input(s): PROBNP in the  last 8760 hours.  CBG: No results for input(s): GLUCAP in the last 168 hours.  Radiological Exams on Admission: Dg Chest Port 1 View  05/24/2015   CLINICAL DATA:  Sudden onset and worsening diarrhea since this evening. Weakness and abdominal pain. Complained of abdominal pain shortly after eating [place] food.  EXAM: PORTABLE CHEST - 1 VIEW  COMPARISON:  None.  FINDINGS: Shallow inspiration. Normal heart size and pulmonary vascularity for technique. Diffuse interstitial pattern to the lungs probably representing chronic fibrosis. Acute interstitial pneumonitis or edema not entirely excluded. No focal consolidation. Blunting of the left costophrenic angle suggesting a small pleural effusion. No pneumothorax. Calcification of the aorta.  IMPRESSION: Small left pleural effusion. Diffuse interstitial pattern to the lungs probably fibrosis but can't exclude acute interstitial process. No focal consolidation.   Electronically Signed   By: Lucienne Capers M.D.   On: 05/24/2015 04:10    EKG: Independently reviewed.  Abnormal findings: QTC 498, right bundle blockade, no old EKG to compare with.   Assessment/Plan Principal Problem:   Sepsis Active Problems:   GIB (gastrointestinal bleeding)   Diabetes mellitus without complication   Glaucoma   UTI (lower urinary tract infection)   Acute encephalopathy   Abdominal pain   Acute renal failure superimposed on stage 3 chronic kidney disease   Elevated lactic acid level   Diarrhea  Addendum: CT-abd/pelvis showed  small left hydro pneumothorax with consolidation or atelectasis in the left lung base. Diffuse distention of stomach and small bowel with transition zone to decompressed terminal ileum, cosistent with small bowel obstruction. -PCCM, Dr. Corinna Lines was consulted -Surgeon, Dr. Georgette Dover was consulted -will put NGT  Sepsis: Patient is septic with leukocytosis, tachycardia and elevated lactate. It is likely due to UTI, but cannot rule out colitis given  abd pain and diarrhea. Patient is hemodynamically stable. Lactate is severely elevated at 7.22, this is likely due to sepsis, but can also be due to hypoperfusion secondary to GI bleeding.  -will admit to SDU -ED started Rocephin, will switch to Cipro plus Flagyl -IVF: 3L NS, followed 75cc/h -will get Procalcitonin and trend lactic acid levels per sepsis protocol.  UTI (lower urinary tract infection: -see above  Acute encephalopathy: Likely due to UTI, sepsis and possible colitis. -Treat underlying problems -Frequent neuro checks -ABG stat  GIB (gastrointestinal bleeding): FOBT positive. Hemoglobin dropped from 15.2 on 04/14/13-->7 0.6, not sure how acutely this happened. Etiology is not clear, likely due to possible colitis given diarrhea and abdominal pain, other possibilities include diverticulitis, diverticulosis bleeding, or upper GI bleeding. - ED ordered two units of blood, will continue - ED ordered IV pantoprazole 80 mg x 1 - Hydroxyzine prn IV for nausea - Maintain IV access (2 large bore IVs if possible). - Monitor closely and follow q6h cbc, transfuse as necessary. - LaB: INR, PTT - troponin x 3 - trend lactate -Will consult GI and  follow  up recommendations  Diet controlled diabetes: No A1c was on record. Blood sugar is 245 by met on admission. -SSI -A1c  Diarrhea and AP: Etiology is not clear. Cannot rule out colitis. Other differential diagnoses include gastroenteritis, pancreatitis, GI bleeding. -CT-abd/pelvis w/o CM -on Cipro plus Flagyl -Lipase -C diff PCR  AoCKD-III: Baseline Cre is 1.63 on 04/14/13, his Cre is 2.51, BUN 66 on admission. Likely due to prerenal secondary to dehydration and hypoperfusion. - IVF as above - Check FeNa - Follow up renal function by BMP - follow up CT abdomen/pelvis to rule out hydronephrosis   DVT ppx: SCD Code Status: Full code(I discussed with his granddaughter extensively, but granddaughter strongly wants patient to be full  code). Family Communication:  Yes, patient's granddaughter at bed side Disposition Plan: Admit to inpatient   Date of Service 05/24/2015    Ivor Costa [facility] Pager [phone]  If 7PM-7AM, please contact night-coverage [email or web address] Password [facility] 05/24/2015, 4:37 AM

## 2015-05-24 NOTE — Consult Note (Signed)
Reason for Consult: bowel obstruction Referring Physician: Dr Francisco Roberts is an 81 y.o. male.  HPI:  Patient's granddaughter states that patient started having abdominal pain last Friday. He did not have nausea, vomiting. He began to have diarrhea yesterday. He had one large bowel movement with loose stool today.  Patient feels very weak and is confused.  Past Medical History  Diagnosis Date  . Diabetes mellitus without complication   . Glaucoma     History reviewed. No pertinent past surgical history.  History reviewed. No pertinent family history.  Social History:  reports that he has quit smoking. He has never used smokeless tobacco. He reports that he does not drink alcohol or use illicit drugs.  Allergies: No Known Allergies  Medications: I have reviewed the patient's current medications.  Results for orders placed or performed during the hospital encounter of 05/24/15 (from the past 48 hour(s))  Comprehensive metabolic panel     Status: Abnormal   Collection Time: 05/24/15  1:16 AM  Result Value Ref Range   Sodium 142 135 - 145 mmol/L   Potassium 4.1 3.5 - 5.1 mmol/L   Chloride 107 101 - 111 mmol/L   CO2 21 (L) 22 - 32 mmol/L   Glucose, Bld 245 (H) 65 - 99 mg/dL   BUN 66 (H) 6 - 20 mg/dL   Creatinine, Ser 2.51 (H) 0.61 - 1.24 mg/dL   Calcium 8.9 8.9 - 10.3 mg/dL   Total Protein 6.6 6.5 - 8.1 g/dL   Albumin 3.1 (L) 3.5 - 5.0 g/dL   AST 34 15 - 41 U/L   ALT 21 17 - 63 U/L   Alkaline Phosphatase 91 38 - 126 U/L   Total Bilirubin 0.7 0.3 - 1.2 mg/dL   GFR calc non Af Amer 21 (L) >60 mL/min   GFR calc Af Amer 24 (L) >60 mL/min    Comment: (NOTE) The eGFR has been calculated using the CKD EPI equation. This calculation has not been validated in all clinical situations. eGFR's persistently <60 mL/min signify possible Chronic Kidney Disease.    Anion gap 14 5 - 15  CBC with Differential     Status: Abnormal   Collection Time: 05/24/15  1:16 AM  Result Value Ref  Range   WBC 25.6 (H) 4.0 - 10.5 K/uL   RBC 2.54 (L) 4.22 - 5.81 MIL/uL   Hemoglobin 7.6 (L) 13.0 - 17.0 g/dL   HCT 23.0 (L) 39.0 - 52.0 %   MCV 90.6 78.0 - 100.0 fL   MCH 29.9 26.0 - 34.0 pg   MCHC 33.0 30.0 - 36.0 g/dL   RDW 15.7 (H) 11.5 - 15.5 %   Platelets 292 150 - 400 K/uL   Neutrophils Relative % 91 (H) 43 - 77 %   Lymphocytes Relative 4 (L) 12 - 46 %   Monocytes Relative 5 3 - 12 %   Eosinophils Relative 0 0 - 5 %   Basophils Relative 0 0 - 1 %   Neutro Abs 23.3 (H) 1.7 - 7.7 K/uL   Lymphs Abs 1.0 0.7 - 4.0 K/uL   Monocytes Absolute 1.3 (H) 0.1 - 1.0 K/uL   Eosinophils Absolute 0.0 0.0 - 0.7 K/uL   Basophils Absolute 0.0 0.0 - 0.1 K/uL   Smear Review MORPHOLOGY UNREMARKABLE   Troponin I     Status: None   Collection Time: 05/24/15  1:16 AM  Result Value Ref Range   Troponin I 0.03 <0.031 ng/mL  Comment:        NO INDICATION OF MYOCARDIAL INJURY.   Urinalysis, Routine w reflex microscopic     Status: Abnormal   Collection Time: 05/24/15  1:30 AM  Result Value Ref Range   Color, Urine AMBER (A) YELLOW    Comment: BIOCHEMICALS MAY BE AFFECTED BY COLOR   APPearance TURBID (A) CLEAR   Specific Gravity, Urine 1.019 1.005 - 1.030   pH 5.0 5.0 - 8.0   Glucose, UA NEGATIVE NEGATIVE mg/dL   Hgb urine dipstick LARGE (A) NEGATIVE   Bilirubin Urine MODERATE (A) NEGATIVE   Ketones, ur NEGATIVE NEGATIVE mg/dL   Protein, ur 100 (A) NEGATIVE mg/dL   Urobilinogen, UA 1.0 0.0 - 1.0 mg/dL   Nitrite NEGATIVE NEGATIVE   Leukocytes, UA LARGE (A) NEGATIVE  Urine microscopic-add on     Status: Abnormal   Collection Time: 05/24/15  1:30 AM  Result Value Ref Range   WBC, UA TOO NUMEROUS TO COUNT <3 WBC/hpf   RBC / HPF 21-50 <3 RBC/hpf   Bacteria, UA MANY (A) RARE  I-Stat CG4 Lactic Acid, ED     Status: Abnormal   Collection Time: 05/24/15  1:46 AM  Result Value Ref Range   Lactic Acid, Venous 7.22 (HH) 0.5 - 2.0 mmol/L   Comment NOTIFIED PHYSICIAN   Type and screen     Status:  None (Preliminary result)   Collection Time: 05/24/15  2:26 AM  Result Value Ref Range   ABO/RH(D) O POS    Antibody Screen NEG    Sample Expiration 05/27/2015    Unit Number [id number]    Blood Component Type RBC LR PHER2    Unit division 00    Status of Unit ALLOCATED    Transfusion Status OK TO TRANSFUSE    Crossmatch Result Compatible    Unit Number [id number]    Blood Component Type RBC LR PHER2    Unit division 00    Status of Unit ISSUED    Transfusion Status OK TO TRANSFUSE    Crossmatch Result Compatible   ABO/Rh     Status: None   Collection Time: 05/24/15  2:26 AM  Result Value Ref Range   ABO/RH(D) O POS   POC occult blood, ED Provider will collect     Status: Abnormal   Collection Time: 05/24/15  2:28 AM  Result Value Ref Range   Fecal Occult Bld POSITIVE (A) NEGATIVE  Protime-INR     Status: None   Collection Time: 05/24/15  3:20 AM  Result Value Ref Range   Prothrombin Time 14.8 11.6 - 15.2 seconds   INR 1.14 0.00 - 1.49  APTT     Status: None   Collection Time: 05/24/15  3:20 AM  Result Value Ref Range   aPTT 32 24 - 37 seconds  Prepare RBC     Status: None   Collection Time: 05/24/15  3:30 AM  Result Value Ref Range   Order Confirmation ORDER PROCESSED BY BLOOD BANK   Blood gas, arterial     Status: Abnormal   Collection Time: 05/24/15  5:24 AM  Result Value Ref Range   FIO2 1.00    O2 Content 15.0 L/min   Delivery systems NON-REBREATHER OXYGEN MASK    pH, Arterial 7.397 7.350 - 7.450   pCO2 arterial 28.8 (L) 35.0 - 45.0 mmHg   pO2, Arterial 66.9 (L) 80.0 - 100.0 mmHg   Bicarbonate 17.9 (L) 20.0 - 24.0 mEq/L   TCO2 16.3 0 - 100  mmol/L   Acid-base deficit 6.0 (H) 0.0 - 2.0 mmol/L   O2 Saturation 93.4 %   Patient temperature 94.7    Collection site LEFT RADIAL    Drawn by [id number]    Sample type ARTERIAL DRAW    Allens test (pass/fail) PASS PASS    Ct Abdomen Pelvis Wo Contrast  05/24/2015   CLINICAL DATA:  Diarrhea and pain tonight with  weakness and reported fall. Pain began after eating [place] food.  EXAM: CT ABDOMEN AND PELVIS WITHOUT CONTRAST  TECHNIQUE: Multidetector CT imaging of the abdomen and pelvis was performed following the standard protocol without IV contrast.  COMPARISON:  None.  FINDINGS: Small left pleural effusion with consolidation in the left lung base. This may indicate atelectasis or pneumonia. There is a small left pneumothorax. No fractures demonstrated in the visualized ribs.  1 cm low-attenuation lesion in the lateral segment left lobe of the liver likely representing a cyst. The unenhanced appearance of the gallbladder, spleen, pancreas, adrenal glands, inferior vena cava, and retroperitoneal lymph nodes is unremarkable. Parapelvic cysts in the kidneys. No evidence of hydronephrosis. Bilateral renal atrophy. The stomach is markedly distended with an air-fluid level. Diffuse distention of multiple small bowel loops with air-fluid levels. Colon is decompressed. Distal small bowel are decompressed. Appearance is consistent with small bowel obstruction at the level of the right lower quadrant distal ileum. No free air or free fluid in the abdomen.  Pelvis: Diffuse bladder wall thickening suggesting cystitis. Prostate gland is enlarged with calcification. Small right inguinal hernia with fluid. No bowel herniation. No free or loculated pelvic fluid collections. Degenerative changes throughout the spine. No destructive bone lesions. Mild scoliosis convex towards the left.  IMPRESSION: Small left hydro pneumothorax with consolidation or atelectasis in the left lung base.  Diffuse distention of stomach and small bowel with transition zone to decompressed terminal ileum. This is consistent with small bowel obstruction.  Small right inguinal hernia without bowel herniation. Prostate enlargement.  These results were called by telephone at the time of interpretation on 05/24/2015 at 7:35 am to Dr. Roxanne Mins and Dr. Blaine Hamper, who verbally  acknowledged these results.   Electronically Signed   By: Lucienne Capers M.D.   On: 05/24/2015 05:20   Dg Chest Port 1 View  05/24/2015   CLINICAL DATA:  Sudden onset and worsening diarrhea since this evening. Weakness and abdominal pain. Complained of abdominal pain shortly after eating [place] food.  EXAM: PORTABLE CHEST - 1 VIEW  COMPARISON:  None.  FINDINGS: Shallow inspiration. Normal heart size and pulmonary vascularity for technique. Diffuse interstitial pattern to the lungs probably representing chronic fibrosis. Acute interstitial pneumonitis or edema not entirely excluded. No focal consolidation. Blunting of the left costophrenic angle suggesting a small pleural effusion. No pneumothorax. Calcification of the aorta.  IMPRESSION: Small left pleural effusion. Diffuse interstitial pattern to the lungs probably fibrosis but can't exclude acute interstitial process. No focal consolidation.   Electronically Signed   By: Lucienne Capers M.D.   On: 05/24/2015 04:10    Review of Systems  Constitutional: Negative for fever and chills.  Eyes: Negative for blurred vision.  Respiratory: Negative for cough and shortness of breath.   Gastrointestinal: Positive for abdominal pain and diarrhea. Negative for nausea and vomiting.  Musculoskeletal: Negative for myalgias.  Skin: Negative for rash.  Neurological: Negative for headaches.   Blood pressure 145/63, pulse 55, temperature 95.5 F (35.3 C), temperature source Other (Comment), resp. rate 18, SpO2 97 %. Physical Exam  Constitutional: No distress.  HENT:  Head: Normocephalic and atraumatic.  Eyes: Conjunctivae are normal. Pupils are equal, round, and reactive to light.  Neck: Normal range of motion. No tracheal deviation present.  Cardiovascular: Regular rhythm and normal heart sounds.   Respiratory: Effort normal. No respiratory distress.  GI: Soft. He exhibits distension. There is tenderness.  Musculoskeletal: Normal range of motion.   Skin: Skin is warm and dry. He is not diaphoretic.    Assessment/Plan: 81 y.o. M with sepsis.  Presumed UTI is source, but abd is tender.  CT findings could due to ileus.  Recommend NG decompression for now.  Palliative consult to discuss goals of care.  No clear need for surgical intervention at the moment.    Francisco Roberts C. 3/54/5625, 6:38 AM

## 2015-05-24 NOTE — ED Provider Notes (Signed)
CSN: [id number]     Arrival date & time 05/24/15  0037 History  This chart was scribed for Dione Booze, MD by Octavia Heir, ED Scribe. This patient was seen in room WA15/WA15 and the patient's care was started at 1:04 AM.    Chief Complaint  Patient presents with  . Failure To Thrive     The history is provided by the EMS personnel. No language interpreter was used.   HPI Comments: Francisco Roberts is a 81 y.o. male who presents to the Emergency Department complaining of sudden onset, gradual worsening diarrhea onset this evening. Pt had associated weakness and abdominal pain. Pt consumed some chinese food given from caregiver and complained of abdominal pain shortly after. She notes pt went to the restroom after having his diarrhea occurred and notes he fell to due to weakness. She denies fevers, chills, vomiting, pain, trouble urinating  Past Medical History  Diagnosis Date  . Diabetes mellitus without complication   . Glaucoma    History reviewed. No pertinent past surgical history. History reviewed. No pertinent family history. Social History  Substance Use Topics  . Smoking status: Former Games developer  . Smokeless tobacco: Never Used  . Alcohol Use: No    Review of Systems  Constitutional: Negative for chills and fatigue.  Gastrointestinal: Positive for abdominal pain and diarrhea. Negative for vomiting.  Neurological: Negative for weakness.  All other systems reviewed and are negative.     Allergies  Review of patient's allergies indicates no known allergies.  Home Medications   Prior to Admission medications   Medication Sig Start Date End Date Taking? Authorizing Provider  acetaminophen (TYLENOL) 500 MG tablet Take 1,000 mg by mouth every 6 (six) hours as needed for mild pain, moderate pain, fever or headache.    Historical Provider, MD  dextromethorphan-guaiFENesin (MUCINEX DM) 30-600 MG per 12 hr tablet Take 1 tablet by mouth 2 (two) times daily as needed for cough.     Historical Provider, MD   Triage vitals: BP 135/56 mmHg  Pulse 100  Temp(Src) 97.5 F (36.4 C) (Oral)  Resp 22  SpO2 96% Physical Exam  Constitutional: He appears well-developed and well-nourished.  HENT:  Head: Normocephalic and atraumatic.  Eyes: EOM are normal. Pupils are equal, round, and reactive to light.  Neck: Normal range of motion. Neck supple. No JVD present.  Cardiovascular: Normal rate, normal heart sounds and intact distal pulses.  An irregular rhythm present.  Distant heart tones  Pulmonary/Chest: Effort normal and breath sounds normal. No respiratory distress. He has no wheezes. He exhibits no tenderness.  Abdominal: Soft. He exhibits no distension and no mass. Bowel sounds are decreased. There is no tenderness.  Decreased bowel sounds  Genitourinary:  Decreased sphincter tone. Soft stool present which is dark, but not overtly melanotic.  Musculoskeletal: Normal range of motion. He exhibits no edema.  Laceration left hand webspace between thumb and second finger.  Lymphadenopathy:    He has no cervical adenopathy.  Neurological: He is alert. No cranial nerve deficit. He exhibits normal muscle tone.  Skin: Skin is warm and dry. No rash noted.  Psychiatric: He has a normal mood and affect.  Nursing note and vitals reviewed.   ED Course  Procedures  DIAGNOSTIC STUDIES: Oxygen Saturation is 96% on RA, normal by my interpretation.  COORDINATION OF CARE:  1:09 AM Discussed treatment plan which includes more testing with pt at bedside and pt agreed to plan.  LACERATION REPAIR Performed by: WUJWJ,XBJYN Authorized by: Dione Booze  Consent: Verbal consent obtained. Risks and benefits: risks, benefits and alternatives were discussed Consent given by: patient Patient identity confirmed: provided demographic data Prepped and Draped in normal sterile fashion Wound explored  Laceration Location: Left hand  Laceration Length: 2.5 cm  No Foreign Bodies seen or  palpated  Anesthesia: None  Amount of cleaning: standard  Skin closure: Close   Technique: Tissue adhesive   Patient tolerance: Patient tolerated the procedure well with no immediate complications.  Labs Review Results for orders placed or performed during the hospital encounter of 05/24/15  Comprehensive metabolic panel  Result Value Ref Range   Sodium 142 135 - 145 mmol/L   Potassium 4.1 3.5 - 5.1 mmol/L   Chloride 107 101 - 111 mmol/L   CO2 21 (L) 22 - 32 mmol/L   Glucose, Bld 245 (H) 65 - 99 mg/dL   BUN 66 (H) 6 - 20 mg/dL   Creatinine, Ser 1.61 (H) 0.61 - 1.24 mg/dL   Calcium 8.9 8.9 - 09.6 mg/dL   Total Protein 6.6 6.5 - 8.1 g/dL   Albumin 3.1 (L) 3.5 - 5.0 g/dL   AST 34 15 - 41 U/L   ALT 21 17 - 63 U/L   Alkaline Phosphatase 91 38 - 126 U/L   Total Bilirubin 0.7 0.3 - 1.2 mg/dL   GFR calc non Af Amer 21 (L) >60 mL/min   GFR calc Af Amer 24 (L) >60 mL/min   Anion gap 14 5 - 15  CBC with Differential  Result Value Ref Range   WBC 25.6 (H) 4.0 - 10.5 K/uL   RBC 2.54 (L) 4.22 - 5.81 MIL/uL   Hemoglobin 7.6 (L) 13.0 - 17.0 g/dL   HCT 04.5 (L) 40.9 - 81.1 %   MCV 90.6 78.0 - 100.0 fL   MCH 29.9 26.0 - 34.0 pg   MCHC 33.0 30.0 - 36.0 g/dL   RDW 91.4 (H) 78.2 - 95.6 %   Platelets 292 150 - 400 K/uL   Neutrophils Relative % 91 (H) 43 - 77 %   Lymphocytes Relative 4 (L) 12 - 46 %   Monocytes Relative 5 3 - 12 %   Eosinophils Relative 0 0 - 5 %   Basophils Relative 0 0 - 1 %   Neutro Abs 23.3 (H) 1.7 - 7.7 K/uL   Lymphs Abs 1.0 0.7 - 4.0 K/uL   Monocytes Absolute 1.3 (H) 0.1 - 1.0 K/uL   Eosinophils Absolute 0.0 0.0 - 0.7 K/uL   Basophils Absolute 0.0 0.0 - 0.1 K/uL   Smear Review MORPHOLOGY UNREMARKABLE   Troponin I  Result Value Ref Range   Troponin I 0.03 <0.031 ng/mL  Urinalysis, Routine w reflex microscopic  Result Value Ref Range   Color, Urine AMBER (A) YELLOW   APPearance TURBID (A) CLEAR   Specific Gravity, Urine 1.019 1.005 - 1.030   pH 5.0 5.0 -  8.0   Glucose, UA NEGATIVE NEGATIVE mg/dL   Hgb urine dipstick LARGE (A) NEGATIVE   Bilirubin Urine MODERATE (A) NEGATIVE   Ketones, ur NEGATIVE NEGATIVE mg/dL   Protein, ur 213 (A) NEGATIVE mg/dL   Urobilinogen, UA 1.0 0.0 - 1.0 mg/dL   Nitrite NEGATIVE NEGATIVE   Leukocytes, UA LARGE (A) NEGATIVE  Urine microscopic-add on  Result Value Ref Range   WBC, UA TOO NUMEROUS TO COUNT <3 WBC/hpf   RBC / HPF 21-50 <3 RBC/hpf   Bacteria, UA MANY (A) RARE  Protime-INR  Result Value Ref Range  Prothrombin Time 14.8 11.6 - 15.2 seconds   INR 1.14 0.00 - 1.49  APTT  Result Value Ref Range   aPTT 32 24 - 37 seconds  Creatinine, urine, random  Result Value Ref Range   Creatinine, Urine 86.80 mg/dL  Sodium, urine, random  Result Value Ref Range   Sodium, Ur 100 mmol/L  Blood gas, arterial  Result Value Ref Range   FIO2 1.00    O2 Content 15.0 L/min   Delivery systems NON-REBREATHER OXYGEN MASK    pH, Arterial 7.397 7.350 - 7.450   pCO2 arterial 28.8 (L) 35.0 - 45.0 mmHg   pO2, Arterial 66.9 (L) 80.0 - 100.0 mmHg   Bicarbonate 17.9 (L) 20.0 - 24.0 mEq/L   TCO2 16.3 0 - 100 mmol/L   Acid-base deficit 6.0 (H) 0.0 - 2.0 mmol/L   O2 Saturation 93.4 %   Patient temperature 94.7    Collection site LEFT RADIAL    Drawn by [id number]    Sample type ARTERIAL DRAW    Allens test (pass/fail) PASS PASS  Glucose, capillary  Result Value Ref Range   Glucose-Capillary 126 (H) 65 - 99 mg/dL  Glucose, capillary  Result Value Ref Range   Glucose-Capillary 98 65 - 99 mg/dL  Glucose, capillary  Result Value Ref Range   Glucose-Capillary 120 (H) 65 - 99 mg/dL   Comment 1 Notify RN    Comment 2 Document in Chart   Glucose, capillary  Result Value Ref Range   Glucose-Capillary 100 (H) 65 - 99 mg/dL  I-Stat CG4 Lactic Acid, ED  Result Value Ref Range   Lactic Acid, Venous 7.22 (HH) 0.5 - 2.0 mmol/L   Comment NOTIFIED PHYSICIAN   POC occult blood, ED Provider will collect  Result Value Ref Range    Fecal Occult Bld POSITIVE (A) NEGATIVE  Type and screen  Result Value Ref Range   ABO/RH(D) O POS    Antibody Screen NEG    Sample Expiration 05/27/2015    Unit Number [id number]    Blood Component Type RBC LR PHER2    Unit division 00    Status of Unit ISSUED    Transfusion Status OK TO TRANSFUSE    Crossmatch Result Compatible    Unit Number [id number]    Blood Component Type RBC LR PHER2    Unit division 00    Status of Unit ISSUED    Transfusion Status OK TO TRANSFUSE    Crossmatch Result Compatible   Prepare RBC  Result Value Ref Range   Order Confirmation ORDER PROCESSED BY BLOOD BANK   ABO/Rh  Result Value Ref Range   ABO/RH(D) O POS    Imaging Review Ct Abdomen Pelvis Wo Contrast  05/24/2015   CLINICAL DATA:  Diarrhea and pain tonight with weakness and reported fall. Pain began after eating [place] food.  EXAM: CT ABDOMEN AND PELVIS WITHOUT CONTRAST  TECHNIQUE: Multidetector CT imaging of the abdomen and pelvis was performed following the standard protocol without IV contrast.  COMPARISON:  None.  FINDINGS: Small left pleural effusion with consolidation in the left lung base. This may indicate atelectasis or pneumonia. There is a small left pneumothorax. No fractures demonstrated in the visualized ribs.  1 cm low-attenuation lesion in the lateral segment left lobe of the liver likely representing a cyst. The unenhanced appearance of the gallbladder, spleen, pancreas, adrenal glands, inferior vena cava, and retroperitoneal lymph nodes is unremarkable. Parapelvic cysts in the kidneys. No evidence of hydronephrosis. Bilateral renal atrophy. The stomach  is markedly distended with an air-fluid level. Diffuse distention of multiple small bowel loops with air-fluid levels. Colon is decompressed. Distal small bowel are decompressed. Appearance is consistent with small bowel obstruction at the level of the right lower quadrant distal ileum. No free air or free fluid in the abdomen.   Pelvis: Diffuse bladder wall thickening suggesting cystitis. Prostate gland is enlarged with calcification. Small right inguinal hernia with fluid. No bowel herniation. No free or loculated pelvic fluid collections. Degenerative changes throughout the spine. No destructive bone lesions. Mild scoliosis convex towards the left.  IMPRESSION: Small left hydro pneumothorax with consolidation or atelectasis in the left lung base.  Diffuse distention of stomach and small bowel with transition zone to decompressed terminal ileum. This is consistent with small bowel obstruction.  Small right inguinal hernia without bowel herniation. Prostate enlargement.  These results were called by telephone at the time of interpretation on 05/24/2015 at 5:11 am to Dr. Preston Fleeting and Dr. Clyde Lundborg, who verbally acknowledged these results.   Electronically Signed   By: Burman Nieves M.D.   On: 05/24/2015 05:20   Dg Chest Port 1 View  05/24/2015   CLINICAL DATA:  Sudden onset and worsening diarrhea since this evening. Weakness and abdominal pain. Complained of abdominal pain shortly after eating [place] food.  EXAM: PORTABLE CHEST - 1 VIEW  COMPARISON:  None.  FINDINGS: Shallow inspiration. Normal heart size and pulmonary vascularity for technique. Diffuse interstitial pattern to the lungs probably representing chronic fibrosis. Acute interstitial pneumonitis or edema not entirely excluded. No focal consolidation. Blunting of the left costophrenic angle suggesting a small pleural effusion. No pneumothorax. Calcification of the aorta.  IMPRESSION: Small left pleural effusion. Diffuse interstitial pattern to the lungs probably fibrosis but can't exclude acute interstitial process. No focal consolidation.   Electronically Signed   By: Burman Nieves M.D.   On: 05/24/2015 04:10   I have personally reviewed and evaluated these images and lab results as part of my medical decision-making.   EKG Interpretation   Date/Time:  Sunday May 24 2015 01:14:54 EDT Ventricular Rate:  101 PR Interval:  163 QRS Duration: 127 QT Interval:  384 QTC Calculation: 498 R Axis:   9 Text Interpretation:  Sinus tachycardia Multiple premature complexes, vent   Right bundle branch block When compared with ECG of 04/14/2013, No  significant change was found Confirmed by [facility]  MD, Myrtis Maille ([id number]) on  05/24/2015 1:24:51 AM      CRITICAL CARE Performed by: UEAVW,UJWJX Total critical care time: 135 minutes Critical care time was exclusive of separately billable procedures and treating other patients. Critical care was necessary to treat or prevent imminent or life-threatening deterioration. Critical care was time spent personally by me on the following activities: development of treatment plan with patient and/or surrogate as well as nursing, discussions with consultants, evaluation of patient's response to treatment, examination of patient, obtaining history from patient or surrogate, ordering and performing treatments and interventions, ordering and review of laboratory studies, ordering and review of radiographic studies, pulse oximetry and re-evaluation of patient's condition.  MDM   Final diagnoses:  Urinary tract infection without hematuria, site unspecified  Symptomatic anemia  Gastrointestinal hemorrhage, unspecified gastritis, unspecified gastrointestinal hemorrhage type  Acute on chronic renal failure  Laceration of left hand, initial encounter  Elevated lactic acid level    Diarrhea and weakness. IV is started with normal saline and screening labs obtained.  Lactic acid level is come back very high as has WBC. He  is noted to have significant anemia with hemoglobin having dropped from 15.2-7.6. Urinalysis is pending but does appear grossly infected. He is started on early goal-directed fluid therapy for suspected sepsis and started on ceftriaxone.  Metabolic panel has come back showing acute on chronic renal failure. High BUN could  reflect dehydration or GI bleed. Rectal exam was done showing stool which is dark but not overtly melanomotic.  Stool is Hemoccult-positive. Blood is ordered for transfusion. At this point, it is unclear how much of his lactic acid elevation is related to infection and how much related to GI bleed. He is hemodynamically stable but clearly at risk for deterioration. I've explained findings to family. They have not discussed CODE STATUS, so at this point he remains full code. Case is discussed with Dr. Clyde [facility]borg of [facility] agrees to admit the patient to stepdown unit.  I personally performed the services described in this documentation, which was scribed in my presence. The recorded information has been reviewed and is accurate.    Dione Booze, MD 05/25/15 0800

## 2015-05-24 NOTE — ED Notes (Signed)
Lab draws are being held at this time d/t 80 ml's total of blood being taken from pt for previously ordered labs.  D/w Dr. Clyde Lundborg who is agreeable to this.  First unit of PRBC's being administered at this time.

## 2015-05-24 NOTE — ED Notes (Signed)
After a long conversation w/ pt's granddaughter concerning pt's health status pt's granddaughter has elected to make the pt a DNR and pursue comfort care.  Pt's granddaughter is aware of pt's disease process and stated, "I just want him to be comfortable."  Pt's granddaughter also verbalized understanding that by withdrawing care she does understand that pt will die and her wish is for pt's death to be a peaceful and comfortable.  Spoke w/ Dr. Clyde Lundborg who gave this writer verbal orders to change pt's code status, place a palliative consult and d/c all labs.  Dr. Preston Fleeting placed DNR order.

## 2015-05-24 NOTE — Progress Notes (Signed)
Subjective: 81 y.o. male with PMH of diet-controlled diabetes, glaucoma,CKD-III, who presents with diarrhea, abdominal pain and AMS. patient started having abdominal pain since the Friday short after he took some [place] food. He did not have nausea, vomiting. He did not have diarrhea until today. He had one large bowel movement with loose stool today.In ED, patient was found to have hemoglobin dropped from 15.2 on 04/14/13-->7.6, WBC 25.6, lactate 7.22, temperature normal, tachycardia, worsening renal function, positive urinalysis for UTI, positive FOBT. Surgery and palliative care was consulted, and no surgical option recommended at this time. patient is somnolent and nonverbal at this time.  Filed Vitals:   05/24/15 1036  BP: 149/60  Pulse: 95  Temp: 97.5 F (36.4 C)  Resp: 18    Chest: Clear Bilaterally Heart : S1S2 RRR Abdomen: Soft, tender to palpation Ext : No edema Neuro: Alert, oriented x 3  A/P Sepsis UTI Altered mental status GI bleed Diarrhea Acute on CKD stage 3 Small bowel obstruction  81 year old male came with abdominal pain diarrhea altered mental status. Found to be profoundly anemic with hemoglobin 7.6. CT scan abdomen showed small bowel obstruction. Surgery saw the patient and recommend NG tube decompression. Patient receiving blood transfusion, IV antibiotic for UTI. Had extensive discussion with granddaughter, at this time the goal is more towards comfort, patient is not stable to undergo extensive procedures. She wants to hold on to the NG tube for now, as it's going to make patient more uncomfortable.     Meredeth Ide [facility] [phone]

## 2015-05-24 NOTE — Progress Notes (Signed)
(  No charge)  Earlier today, I was asked by Dr. Clyde Lundborg to consult on this patient because of heme positive stool and anemia.  In reviewing the record, however, it has become evident that the goals of care have transitioned more toward palliation. Therefore, I discussed the case with Dr. Sharl Ma, and we came to the mutual conclusion that a consult from me is not necessary at this time.  However, please feel free to call me if things should evolve in a way where my assistance would be useful.  Florencia Reasons, M.D. Pager [phone] If no answer or after 5 PM call [phone]

## 2015-05-24 NOTE — Progress Notes (Signed)
[name] granddaughter Francisco Roberts and her husband were bedside when I arrived. Pt responded when I spoke to him. Francisco Roberts told me she knows it doesn't look good and it (his passing) could be hours or days. She tearfully described how she just went through this with her mother in March. She is also taking care of her grandmother, [name] wife. She was tearful as she shared how she has lost so many family members including two brothers in addition to her mother. Francisco Roberts has layers of grief and was appropriately grieving for the expectant loss of her grandfather. She shared her faith but was concerned about her grandfather. I listened as she asked questions and provided answers as appropriate from which she seemed to gain assurance and peace. Pt was thankful for visit and answering her question about her grandfather's spiritual condition. Please page if additional support is needed. [phone] Chaplain Elmarie Shiley Holder   05/24/15 1000  Clinical Encounter Type  Visited With Patient and family together

## 2015-05-24 NOTE — ED Notes (Signed)
Pt's O2 sat on RA noted to be 85%.  Placed pt on 2L of O2 via [place] w/o. Gradually increased O2 to 4L w/ an improvement in O2 sat to 93%.  Will continue to monitor and inform Dr. Preston Fleeting.

## 2015-05-24 NOTE — Consult Note (Signed)
Consultation Note Date: 05/24/2015   Patient Name: Francisco Roberts  DOB: [birth year]  MRN: [id number]  Age / Sex: 81 y.o., male   PCP: Fanny Bien, MD Referring Physician: Ivor Costa, MD  Reason for Consultation: Establishing goals of care  Palliative Care Assessment and Plan Summary of Established Goals of Care and Medical Treatment Preferences  81 y.o. male with PMH of diet-controlled diabetes, glaucoma,CKD-III, who presents with diarrhea, abdominal pain and AMS.  Patient has AMS and is unable to provide medical history, therefore, most of the history is obtained by discussing the case with ED physician and his granddaughter. It seems that patient started having abdominal pain since the Friday short after he took some [place] food. He did not have nausea, vomiting. He did not have diarrhea until today. He had one large bowel movement with loose stool today. Not sure whether the patient had blood in stool per her granddaughter. Patient feels very weak, and is confused. He still does not have nausea or vomiting. Not sure whether patient has symptoms of a UTI. He moves all extremities. Does not complain of chest pain, shortness of breath and cough.   In ED, patient was found to have hemoglobin of 7.6, WBC 25.6, lactate 7.22, temperature normal, tachycardia, worsening renal function, positive urinalysis for UTI, positive FOBT. CT abdomen with Small left hydro pneumothorax with consolidation or atelectasis in the left lung base.Diffuse distention of stomach and small bowel with transition zoneto decompressed terminal ileum. This is consistent with small bowel obstruction. It is noted that after further discussions between the ED staff, admitting hospitalist and the patient's grand daughter, DNR DNI was established, it was deemed important to focus on comfort measures only and hence, a palliative consult was placed.   Met with the patient, his grand daughter Francisco Roberts and also Kimberly's husband at the  bedside. The patient lives in [place], [place] with his wife, his primary caregiver is Francisco Roberts, who moved from [place] to help care for him. Kimberly's mother ( the patient's daughter) died in 11/25/2022 this year.   Francisco Roberts describes that the patient has a good quality of life, lives at home with his wife, Francisco Roberts and [place] husband and their 6 children. The patient has had some "chest cold" type symptoms and had some chills for the past few days. On 05-22-15, the patient had some chinese food, on 05-23-15 the patient complained of abdominal pain, diarrhea, was weak with altered mental status and shortness of breath. He was brought into the ED in the early hours of 05-24-15.   After work up in the ED, it appears that the patient has sepsis, likely urinary source, pulmonary consolidation, small Pneumothorax, possible GIB, CT evidence of SBO. After discussion, DNR was established and a palliative consult was placed. Discussed with patient's grand daughter Francisco Roberts about the patient's multiple acute co morbidities, that the patient might not survive this hospitalization. Discussed comfort measures.   PLAN: DNR DNI Continue Antibiotics for comfort at this point Chaplain/minister follow up Comfort measures Morphine IV PRN    Contacts/Participants in Discussion: Primary Decision Maker:  Patient's grand daughter Joanna Puff  HCPOA: no     Code Status/Advance Care Planning:  After extensive discussions with grand daughter, as patient himself is not currently decisional, DNR DNI has been established in the ED, this has been confirmed by me.   Symptom Management:   Continue PRN IV Morphine. Consider PRN benzodiazepines for comfort care  Palliative Prophylaxis: Add Zofran IV  Additional  Recommendations (Limitations, Scope, Preferences):   watch disease trajectory, discussed with grand daughter that the patient has multiple acute co morbidities, that he may not survive this  hospitalization  Discussed DNR DNI, gentle treatments and comfort measures Psycho-social/Spiritual:   Support System: strong, patient is cared for at home by his grand daughter, he lives at home with his wife.   Desire for further Chaplaincy support:no. Alden daughter will call in her own minister.   Prognosis: Hours - Days  Discharge Planning:   likely hospital death   Values: shifting towards establishing comfort as singular goal, discussed in detail with primary caregiver grand daughter Francisco Roberts Life limiting illness: SBO, sepsis, UTI, possible GIB      Chief Complaint/History of Present Illness: abdominal pain, confusion, weakness.   Primary Diagnoses  Present on Admission:  . GIB (gastrointestinal bleeding) . Glaucoma . UTI (lower urinary tract infection) . Sepsis . Acute encephalopathy . Elevated lactic acid level . Acute renal failure superimposed on stage 3 chronic kidney disease . Abdominal pain . Diarrhea  Palliative Review of Systems:  completed  I have reviewed the medical record, interviewed the patient and family, and examined the patient. The following aspects are pertinent.  Past Medical History  Diagnosis Date  . Diabetes mellitus without complication   . Glaucoma    Social History   Social History  . Marital Status: Married    Spouse Name: N/A  . Number of Children: N/A  . Years of Education: N/A   Social History Main Topics  . Smoking status: Former Research scientist (life sciences)  . Smokeless tobacco: Never Used  . Alcohol Use: No  . Drug Use: No  . Sexual Activity: Not Asked   Other Topics Concern  . None   Social History Narrative   History reviewed. No pertinent family history. Scheduled Meds: . ciprofloxacin  400 mg Intravenous STAT  . insulin aspart  0-9 Units Subcutaneous TID WC  . metronidazole  500 mg Intravenous Q8H  . sodium chloride  3 mL Intravenous Q12H   Continuous Infusions: . sodium chloride 1,000 mL (05/24/15 0825)   PRN  Meds:.acetaminophen **OR** acetaminophen, hydrOXYzine, morphine injection Medications Prior to Admission:  Prior to Admission medications   Medication Sig Start Date End Date Taking? Authorizing Provider  DM-Phenylephrine-Acetaminophen (TYLENOL COLD HEAD CONGESTION) 10-5-325 MG TABS Take 2 tablets by mouth once.   Yes Historical Provider, MD   No Known Allergies CBC:    Component Value Date/Time   WBC 25.6* 05/24/2015 0116   HGB 7.6* 05/24/2015 0116   HCT 23.0* 05/24/2015 0116   PLT 292 05/24/2015 0116   MCV 90.6 05/24/2015 0116   NEUTROABS 23.3* 05/24/2015 0116   LYMPHSABS 1.0 05/24/2015 0116   MONOABS 1.3* 05/24/2015 0116   EOSABS 0.0 05/24/2015 0116   BASOSABS 0.0 05/24/2015 0116   Comprehensive Metabolic Panel:    Component Value Date/Time   NA 142 05/24/2015 0116   K 4.1 05/24/2015 0116   CL 107 05/24/2015 0116   CO2 21* 05/24/2015 0116   BUN 66* 05/24/2015 0116   CREATININE 2.51* 05/24/2015 0116   GLUCOSE 245* 05/24/2015 0116   CALCIUM 8.9 05/24/2015 0116   AST 34 05/24/2015 0116   ALT 21 05/24/2015 0116   ALKPHOS 91 05/24/2015 0116   BILITOT 0.7 05/24/2015 0116   PROT 6.6 05/24/2015 0116   ALBUMIN 3.1* 05/24/2015 0116    Physical Exam: Vital Signs: BP 145/63 mmHg  Pulse 55  Temp(Src) 95.5 F (35.3 C) (Other (Comment))  Resp 18  SpO2 97% SpO2: SpO2: 97 % O2 Device: O2 Device: Not Delivered O2 Flow Rate:   Intake/output summary:  Intake/Output Summary (Last 24 hours) at 05/24/15 0951 Last data filed at 05/24/15 0751  Gross per 24 hour  Intake    300 ml  Output    200 ml  Net    100 ml   LBM:  according to grand daughter, patient had diarrhea on 05-23-15 Baseline Weight:   Most recent weight:    Exam Findings:   elderly male encephalopathic on bair hugger in ED Grunts at times, opens eyes when name called otherwise does not verbalize Shallow breathing Minimal bowel sounds Irregular                        Palliative Performance Scale:  10% Additional Data Reviewed: Recent Labs     05/24/15  0116  WBC  25.6*  HGB  7.6*  PLT  292  NA  142  BUN  66*  CREATININE  2.51*     Time In: 0800 Time Out: 0900 Time Total: 60 min  Greater than 50%  of this time was spent counseling and coordinating care related to the above assessment and plan.  Signed by: Loistine Chance, MD Sneads, MD  05/24/2015, 9:51 AM  Please contact Palliative Medicine Team phone at [phone] for questions and concerns.

## 2015-05-24 NOTE — ED Notes (Signed)
Bed: WU98 Expected date:  Expected time:  Means of arrival:  Comments: 81 yr old, weakness

## 2015-05-24 NOTE — Progress Notes (Signed)
RN spoke with palliative MD regarding NG tube placement for decompression per recommendation of surgery MD. Smith Mince MD states spoke extensively with patient's granddaughter and it was felt that at this time patient does not have signs of distention, nausea, vomiting, or pain and as such NG placement would create unnecessary discomfort.  Per palliative MD we are to hold off on placing NG tube at this time.

## 2015-05-24 NOTE — Progress Notes (Signed)
ANTIBIOTIC CONSULT NOTE - INITIAL  Pharmacy Consult for Cipro Indication: Sepsis likely due to UTI  No Known Allergies  Patient Measurements:   Current Height/Weight pending  Vital Signs: Temp: 94.5 F (34.7 C) (09/11 0515) Temp Source: Rectal (09/11 0502) BP: 177/80 mmHg (09/11 0515) Pulse Rate: 102 (09/11 0515) Intake/Output from previous day:   Intake/Output from this shift:    Labs:  Recent Labs  05/24/15 0116  WBC 25.6*  HGB 7.6*  PLT 292  CREATININE 2.51*   CrCl cannot be calculated (Unknown ideal weight.). No results for input(s): VANCOTROUGH, VANCOPEAK, VANCORANDOM, GENTTROUGH, GENTPEAK, GENTRANDOM, TOBRATROUGH, TOBRAPEAK, TOBRARND, AMIKACINPEAK, AMIKACINTROU, AMIKACIN in the last 72 hours.   Microbiology: No results found for this or any previous visit (from the past 720 hour(s)).  Medical History: Past Medical History  Diagnosis Date  . Diabetes mellitus without complication   . Glaucoma     Medications:  Scheduled:  . insulin aspart  0-9 Units Subcutaneous TID WC  . sodium chloride  3 mL Intravenous Q12H   Infusions:  . sodium chloride    . ciprofloxacin    . metronidazole     Assessment: 81 yr male with diarrhea, abdominal pain and altered mental status.  Patient with GI bleed.  Temp = 64F, WBC 25.6  Scr = 2.51  Blood, urine, CDiff cultures ordered Pharmacy consulted to dose Cipro in this septic patient, likely due to UTI. Patient with possible colitis.  MD has also ordered Flagyl.  Goal of Therapy:  Eradication of infection  Plan:  Follow up culture results   Cipro  IV x 1 now  Once height/weight documented, will order standing regimen of Cipro  Kyria Bumgardner, Joselyn Glassman, PharmD 05/24/2015,5:28 AM

## 2015-05-25 ENCOUNTER — Inpatient Hospital Stay (HOSPITAL_COMMUNITY): Payer: Medicare HMO

## 2015-05-25 DIAGNOSIS — E872 Acidosis: Secondary | ICD-10-CM

## 2015-05-25 DIAGNOSIS — R531 Weakness: Secondary | ICD-10-CM

## 2015-05-25 DIAGNOSIS — R197 Diarrhea, unspecified: Secondary | ICD-10-CM

## 2015-05-25 DIAGNOSIS — Z7189 Other specified counseling: Secondary | ICD-10-CM

## 2015-05-25 DIAGNOSIS — Z515 Encounter for palliative care: Secondary | ICD-10-CM

## 2015-05-25 LAB — COMPREHENSIVE METABOLIC PANEL
ALBUMIN: 2.3 g/dL — AB (ref 3.5–5.0)
ALT: 15 U/L — ABNORMAL LOW (ref 17–63)
ANION GAP: 5 (ref 5–15)
AST: 17 U/L (ref 15–41)
Alkaline Phosphatase: 62 U/L (ref 38–126)
BILIRUBIN TOTAL: 0.5 mg/dL (ref 0.3–1.2)
BUN: 53 mg/dL — ABNORMAL HIGH (ref 6–20)
CO2: 19 mmol/L — ABNORMAL LOW (ref 22–32)
Calcium: 7.9 mg/dL — ABNORMAL LOW (ref 8.9–10.3)
Chloride: 118 mmol/L — ABNORMAL HIGH (ref 101–111)
Creatinine, Ser: 1.87 mg/dL — ABNORMAL HIGH (ref 0.61–1.24)
GFR calc non Af Amer: 30 mL/min — ABNORMAL LOW (ref >60)
GFR, EST AFRICAN AMERICAN: 35 mL/min — AB (ref >60)
GLUCOSE: 102 mg/dL — AB (ref 65–99)
POTASSIUM: 4.2 mmol/L (ref 3.5–5.1)
SODIUM: 142 mmol/L (ref 135–145)
TOTAL PROTEIN: 5 g/dL — AB (ref 6.5–8.1)

## 2015-05-25 LAB — TYPE AND SCREEN
ABO/RH(D): O POS
Antibody Screen: NEGATIVE
UNIT DIVISION: 0
Unit division: 0

## 2015-05-25 LAB — GLUCOSE, CAPILLARY
GLUCOSE-CAPILLARY: 100 mg/dL — AB (ref 65–99)
GLUCOSE-CAPILLARY: 73 mg/dL (ref 65–99)
Glucose-Capillary: 97 mg/dL (ref 65–99)
Glucose-Capillary: 81 mg/dL (ref 65–99)

## 2015-05-25 LAB — CBC
HEMATOCRIT: 32.8 % — AB (ref 39.0–52.0)
Hemoglobin: 10.8 g/dL — ABNORMAL LOW (ref 13.0–17.0)
MCH: 29.8 pg (ref 26.0–34.0)
MCHC: 32.9 g/dL (ref 30.0–36.0)
MCV: 90.4 fL (ref 78.0–100.0)
Platelets: 176 10*3/uL (ref 150–400)
RBC: 3.63 MIL/uL — ABNORMAL LOW (ref 4.22–5.81)
RDW: 16.4 % — AB (ref 11.5–15.5)
WBC: 9.1 10*3/uL (ref 4.0–10.5)

## 2015-05-25 MED ORDER — PANTOPRAZOLE SODIUM 40 MG IV SOLR
40.0000 mg | INTRAVENOUS | Status: DC
  Administered 2015-05-25 – 2015-05-26 (×2): 40 mg via INTRAVENOUS
  Filled 2015-05-25 (×2): qty 40

## 2015-05-25 NOTE — Progress Notes (Signed)
I have reviewed the patients records.  WBC is normal, afebrile.  It appears the family has transitioned the goals of care to comfort measures.  Palliative care is following and has an active role.  Will sign off.  If anything were to change, please feel free to call us back.     Jorje Guild, PA-C General Surgery [facility] Surgery [phone]

## 2015-05-25 NOTE — Progress Notes (Signed)
[facility] PROGRESS NOTE  Dinesh Ulysse ZOX:[id number] DOB: 1922/03/14 DOA: 05/24/2015 PCP: Maryelizabeth Rowan, MD  Assessment/Plan: 1. Sepsis-resolved, multifactorial due to UTI, gastroenteritis. Patient has improved, white count is Normal. Continue ciprofloxacin and Flagyl. Will await final urine culture and blood culture results. Urine cultures growing gram-negative rods 50,000 colonies per mL. Blood cultures 2 are negative so far. 2. Anemia- when patient came to the hospital the hemoglobin was 7.6, this morning hemoglobin 10.8 without any blood transfusion. Will follow CBC in a.m. 3. Small bowel obstruction- patient CT scan showed small bowel obstruction, Gen. surgery was consulted and NG tube was recommended but patient's granddaughter did not want him to put him through discomfort. He was not placed. Patient this morning denies nausea vomiting. Will obtain abdominal x-ray to assess the small bowel obstruction. 4. Left hydropneumothorax- CT scan abdomen showed small left hydropneumothorax, patient currently not requiring any oxygen O2 sats are 99% on room air. Will get swallow evaluation to assess for possible aspiration. 5. UTI- will follow urine culture results, continue ciprofloxacin. 6. GI bleed- when patient presented hemoglobin was 7.6, FOBT was positive. GI was consulted. The patient was not found to be a candidate for any intervention. This morning hemoglobin is 10.8. We will follow CBC in a.m. we'll start IV Protonix 40 mg daily. 7. Diabetes mellitus- currently patient is nothing by mouth, sliding-scale insulin.  8. Acute on CT the stage III- patient doesn't live with creatinine of 2.51 and BUN 66. Patient with an creatinine 1.63 on 04/14/2013. Today creatinine is improved to 1.87 with BUN 53. 9. DVT prophylaxis- SCDs  Code Status: DO NOT RESUSCITATE Family Communication: *Discussed with patient's granddaughter at bedside Disposition Plan: Skilled nursing facility versus  hospice   Consultants:  GI  Surgery  Palliative care  Procedures:  None  Antibiotics:  Ciprofloxacin  Flagyl  HPI/Subjective: 81 year old male with a history of diabetes mellitus, glaucoma, C KD stage III who came with diarrhea, abdominal pain and altered mental status. The ED patient was found to have profound anemia with hemoglobin 7.6, white count 25.6, lactate 7.22 UA was positive for UTI. FOBT was positive. Palliative care was consulted for goals of care. Patient was started on IV ciprofloxacin and Flagyl Urine culture is growing 50,000 colonies of gram-negative rods, blood cultures 2 are negative so far.  This morning patient is alert, oriented 2. Answered all the questions appropriately. Denies any complaints. WBC is back to normal 9.1, hemoglobin 10.8    Objective: Filed Vitals:   05/25/15 1441  BP: 107/49  Pulse: 76  Temp: 97.4 F (36.3 C)  Resp: 18    Intake/Output Summary (Last 24 hours) at 05/25/15 1756 Last data filed at 05/25/15 1300  Gross per 24 hour  Intake      3 ml  Output   1000 ml  Net   -997 ml   Filed Weights   05/24/15 1251  Weight: 81.1 kg (178 lb 12.7 oz)    Exam:   General:  Appears in no acute distress  Cardiovascular: S1-S2 regular  Respiratory: Clear to auscultation bilaterally  Abdomen: Soft, nontender, no organomegaly  Musculoskeletal: No edema of the lower extremities   Data Reviewed: Basic Metabolic Panel:  Recent Labs Lab 05/24/15 0116 05/25/15 0902  NA 142 142  K 4.1 4.2  CL 107 118*  CO2 21* 19*  GLUCOSE 245* 102*  BUN 66* 53*  CREATININE 2.51* 1.87*  CALCIUM 8.9 7.9*   Liver Function Tests:  Recent Labs Lab 05/24/15 0116 05/25/15  0902  AST 34 17  ALT 21 15*  ALKPHOS 91 62  BILITOT 0.7 0.5  PROT 6.6 5.0*  ALBUMIN 3.1* 2.3*   No results for input(s): LIPASE, AMYLASE in the last 168 hours. No results for input(s): AMMONIA in the last 168 hours. CBC:  Recent Labs Lab 05/24/15 0116  05/25/15 0902  WBC 25.6* 9.1  NEUTROABS 23.3*  --   HGB 7.6* 10.8*  HCT 23.0* 32.8*  MCV 90.6 90.4  PLT 292 176   Cardiac Enzymes:  Recent Labs Lab 05/24/15 0116  TROPONINI 0.03   BNP (last 3 results) No results for input(s): BNP in the last 8760 hours.  ProBNP (last 3 results) No results for input(s): PROBNP in the last 8760 hours.  CBG:  Recent Labs Lab 05/24/15 1722 05/24/15 2146 05/25/15 0733 05/25/15 1210 05/25/15 1653  GLUCAP 98 120* 100* 97 73    Recent Results (from the past 240 hour(s))  Urine culture     Status: None (Preliminary result)   Collection Time: 05/24/15 12:40 AM  Result Value Ref Range Status   Specimen Description URINE, RANDOM  Final   Special Requests NONE  Final   Culture   Final    50,000 COLONIES/mL GRAM NEGATIVE RODS Performed at [facility]    Report Status PENDING  Incomplete  Culture, blood (routine x 2)     Status: None (Preliminary result)   Collection Time: 05/24/15  2:00 AM  Result Value Ref Range Status   Specimen Description BLOOD LEFT AC  Final   Special Requests BOTTLES DRAWN AEROBIC AND ANAEROBIC 5CC  Final   Culture   Final    NO GROWTH 1 DAY Performed at [facility]    Report Status PENDING  Incomplete  Culture, blood (routine x 2)     Status: None (Preliminary result)   Collection Time: 05/24/15  2:00 AM  Result Value Ref Range Status   Specimen Description BLOOD FOREARM  Final   Special Requests BOTTLES DRAWN AEROBIC AND ANAEROBIC 5CC  Final   Culture   Final    NO GROWTH 1 DAY Performed at [facility]    Report Status PENDING  Incomplete     Studies: Ct Abdomen Pelvis Wo Contrast  05/24/2015   CLINICAL DATA:  Diarrhea and pain tonight with weakness and reported fall. Pain began after eating [place] food.  EXAM: CT ABDOMEN AND PELVIS WITHOUT CONTRAST  TECHNIQUE: Multidetector CT imaging of the abdomen and pelvis was performed following the standard protocol without IV contrast.   COMPARISON:  None.  FINDINGS: Small left pleural effusion with consolidation in the left lung base. This may indicate atelectasis or pneumonia. There is a small left pneumothorax. No fractures demonstrated in the visualized ribs.  1 cm low-attenuation lesion in the lateral segment left lobe of the liver likely representing a cyst. The unenhanced appearance of the gallbladder, spleen, pancreas, adrenal glands, inferior vena cava, and retroperitoneal lymph nodes is unremarkable. Parapelvic cysts in the kidneys. No evidence of hydronephrosis. Bilateral renal atrophy. The stomach is markedly distended with an air-fluid level. Diffuse distention of multiple small bowel loops with air-fluid levels. Colon is decompressed. Distal small bowel are decompressed. Appearance is consistent with small bowel obstruction at the level of the right lower quadrant distal ileum. No free air or free fluid in the abdomen.  Pelvis: Diffuse bladder wall thickening suggesting cystitis. Prostate gland is enlarged with calcification. Small right inguinal hernia with fluid. No bowel herniation. No free or  loculated pelvic fluid collections. Degenerative changes throughout the spine. No destructive bone lesions. Mild scoliosis convex towards the left.  IMPRESSION: Small left hydro pneumothorax with consolidation or atelectasis in the left lung base.  Diffuse distention of stomach and small bowel with transition zone to decompressed terminal ileum. This is consistent with small bowel obstruction.  Small right inguinal hernia without bowel herniation. Prostate enlargement.  These results were called by telephone at the time of interpretation on 05/24/2015 at 5:11 am to Dr. Preston Fleeting and Dr. Clyde Lundborg, who verbally acknowledged these results.   Electronically Signed   By: Burman Nieves M.D.   On: 05/24/2015 05:20   Dg Chest Port 1 View  05/24/2015   CLINICAL DATA:  Sudden onset and worsening diarrhea since this evening. Weakness and abdominal pain.  Complained of abdominal pain shortly after eating [place] food.  EXAM: PORTABLE CHEST - 1 VIEW  COMPARISON:  None.  FINDINGS: Shallow inspiration. Normal heart size and pulmonary vascularity for technique. Diffuse interstitial pattern to the lungs probably representing chronic fibrosis. Acute interstitial pneumonitis or edema not entirely excluded. No focal consolidation. Blunting of the left costophrenic angle suggesting a small pleural effusion. No pneumothorax. Calcification of the aorta.  IMPRESSION: Small left pleural effusion. Diffuse interstitial pattern to the lungs probably fibrosis but can't exclude acute interstitial process. No focal consolidation.   Electronically Signed   By: Burman Nieves M.D.   On: 05/24/2015 04:10    Scheduled Meds: . ciprofloxacin  400 mg Intravenous Q24H  . insulin aspart  0-9 Units Subcutaneous TID WC  . metronidazole  500 mg Intravenous Q8H  . sodium chloride  3 mL Intravenous Q12H   Continuous Infusions: . sodium chloride 1,000 mL (05/24/15 0825)    Principal Problem:   Sepsis Active Problems:   GIB (gastrointestinal bleeding)   Diabetes mellitus without complication   Glaucoma   UTI (lower urinary tract infection)   Acute encephalopathy   Abdominal pain   Acute renal failure superimposed on stage 3 chronic kidney disease   Elevated lactic acid level   Diarrhea   Acute on chronic renal failure   Bleeding gastrointestinal   Encounter for palliative care    Time spent: 25 min    [facility]  [facility] Pager [phone]*. If 7PM-7AM, please contact night-coverage at [email or web address], password [facility] 05/25/2015, 5:56 PM  LOS: 1 day

## 2015-05-25 NOTE — Progress Notes (Signed)
Daily Progress Note   Patient Name: Francisco Roberts       Date: 05/25/2015 DOB: 1922-01-28  Age: 81 y.o. MRN#: [id number] Attending Physician: Meredeth Ide, MD Primary Care Physician: Maryelizabeth Rowan, MD Admit Date: 05/24/2015  Reason for Consultation/Follow-up: Establishing goals of care, Non pain symptom management, Pain control and Psychosocial/spiritual support  Subjective:     -f/u conversation with grand-daughter Cala Bradford regarding diagnosis, prognosis, goals of care and options  -she verbalizes her concern and frustration related to their experience on admission and that they felt like " they dismissed my grand-father because he is old", "it felt really bad"  -we discussed the concept of failure to thrive and the progression of demtia, she verbalizes an understanding of long term poor prognosis but at this time remains hopeful for improvement and return to home   Length of Stay: 1 day  Current Medications: Scheduled Meds:  . ciprofloxacin  400 mg Intravenous Q24H  . insulin aspart  0-9 Units Subcutaneous TID WC  . metronidazole  500 mg Intravenous Q8H  . sodium chloride  3 mL Intravenous Q12H    Continuous Infusions: . sodium chloride 1,000 mL (05/24/15 0825)    PRN Meds: acetaminophen **OR** acetaminophen, hydrOXYzine, morphine injection  Palliative Performance Scale: 30 %     Vital Signs: BP 107/49 mmHg  Pulse 76  Temp(Src) 97.4 F (36.3 C) (Axillary)  Resp 18  Ht 6' 2.02" (1.88 m)  Wt 81.1 kg (178 lb 12.7 oz)  BMI 22.95 kg/m2  SpO2 99% SpO2: SpO2: 99 % O2 Device: O2 Device: Not Delivered O2 Flow Rate: O2 Flow Rate (L/min): 2 L/min  Intake/output summary:  Intake/Output Summary (Last 24 hours) at 05/25/15 1559 Last data filed at 05/25/15 1300  Gross per 24 hour  Intake      3 ml  Output   1400 ml  Net  -1397 ml   LBM: Last BM Date: 05/24/15 Baseline Weight: Weight: 81.1 kg (178 lb 12.7 oz) Most recent weight: Weight: 81.1 kg (178 lb 12.7  oz)  Physical Exam:             General: NAD, minimal verbal exchange, unable to follow simple commands HEENT: moist buccal membranes CVS:RRR Resp: decreased in bases Abd: soft NT +BS Skin:warm and dry   Neuro:   Additional Data Reviewed: Recent Labs     05/24/15  0116  05/25/15  0902  WBC  25.6*  9.1  HGB  7.6*  10.8*  PLT  292  176  NA  142  142  BUN  66*  53*  CREATININE  2.51*  1.87*     Problem List:  Patient Active Problem List   Diagnosis Date Noted  . GIB (gastrointestinal bleeding) 05/24/2015  . UTI (lower urinary tract infection) 05/24/2015  . Sepsis 05/24/2015  . Acute encephalopathy 05/24/2015  . Diarrhea 05/24/2015  . Diabetes mellitus without complication   . Glaucoma   . Abdominal pain   . Acute renal failure superimposed on stage 3 chronic kidney disease   . Elevated lactic acid level   . Acute on chronic renal failure   . Bleeding gastrointestinal   . Encounter for palliative care      Palliative Care Assessment & Plan    Code Status:  DNR  Goals of Care:  Treat the treatable, family hopeful to return to baseline and return home   Desire for further Chaplaincy support:no-strong community church support   Symptom Management:  Weakness:  Pt evaluation and  treatment     Prognosis: < 6 months  5. Discharge Planning: Home with Hospice, if eligible at discharge, will continue to support   Care plan was discussed with Dr Sharl Ma  Thank you for allowing the Palliative Medicine Team to assist in the care of this patient.   Time In: 1500 Time Out: 1600 Total Time 60 min Prolonged Time Billed  no     Greater than 50%  of this time was spent counseling and coordinating care related to the above assessment and plan.     Canary Brim, NP  05/25/2015, 3:59 PM  Please contact Palliative Medicine Team phone at [phone] for questions and concerns.

## 2015-05-26 LAB — GLUCOSE, CAPILLARY
GLUCOSE-CAPILLARY: 59 mg/dL — AB (ref 65–99)
GLUCOSE-CAPILLARY: 162 mg/dL — AB (ref 65–99)
GLUCOSE-CAPILLARY: 120 mg/dL — AB (ref 65–99)
Glucose-Capillary: 163 mg/dL — ABNORMAL HIGH (ref 65–99)
Glucose-Capillary: 138 mg/dL — ABNORMAL HIGH (ref 65–99)

## 2015-05-26 LAB — COMPREHENSIVE METABOLIC PANEL
ALBUMIN: 2.3 g/dL — AB (ref 3.5–5.0)
ALK PHOS: 60 U/L (ref 38–126)
ALT: 15 U/L — AB (ref 17–63)
AST: 15 U/L (ref 15–41)
Anion gap: 6 (ref 5–15)
BUN: 41 mg/dL — AB (ref 6–20)
CALCIUM: 8 mg/dL — AB (ref 8.9–10.3)
CO2: 19 mmol/L — AB (ref 22–32)
CREATININE: 1.56 mg/dL — AB (ref 0.61–1.24)
Chloride: 119 mmol/L — ABNORMAL HIGH (ref 101–111)
GFR calc Af Amer: 43 mL/min — ABNORMAL LOW (ref >60)
GFR calc non Af Amer: 37 mL/min — ABNORMAL LOW (ref >60)
GLUCOSE: 77 mg/dL (ref 65–99)
Potassium: 4 mmol/L (ref 3.5–5.1)
SODIUM: 144 mmol/L (ref 135–145)
Total Bilirubin: 0.5 mg/dL (ref 0.3–1.2)
Total Protein: 4.9 g/dL — ABNORMAL LOW (ref 6.5–8.1)

## 2015-05-26 LAB — CBC
HCT: 34 % — ABNORMAL LOW (ref 39.0–52.0)
HEMOGLOBIN: 11.2 g/dL — AB (ref 13.0–17.0)
MCH: 29.9 pg (ref 26.0–34.0)
MCHC: 32.9 g/dL (ref 30.0–36.0)
MCV: 90.7 fL (ref 78.0–100.0)
Platelets: 190 10*3/uL (ref 150–400)
RBC: 3.75 MIL/uL — AB (ref 4.22–5.81)
RDW: 16.7 % — ABNORMAL HIGH (ref 11.5–15.5)
WBC: 5.9 10*3/uL (ref 4.0–10.5)

## 2015-05-26 LAB — URINE CULTURE: Culture: 50000

## 2015-05-26 MED ORDER — DEXTROSE 50 % IV SOLN
INTRAVENOUS | Status: AC
  Administered 2015-05-26: 50 mL
  Filled 2015-05-26: qty 50

## 2015-05-26 MED ORDER — DEXTROSE-NACL 5-0.45 % IV SOLN
INTRAVENOUS | Status: DC
  Administered 2015-05-26 – 2015-05-27 (×2): via INTRAVENOUS

## 2015-05-26 MED ORDER — CIPROFLOXACIN IN D5W 400 MG/200ML IV SOLN
400.0000 mg | Freq: Two times a day (BID) | INTRAVENOUS | Status: DC
  Administered 2015-05-26 – 2015-05-27 (×2): 400 mg via INTRAVENOUS
  Filled 2015-05-26 (×2): qty 200

## 2015-05-26 MED ORDER — DEXTROSE 50 % IV SOLN: 50.0000 mL | Freq: Once | INTRAVENOUS | Status: AC

## 2015-05-26 NOTE — Progress Notes (Signed)
ANTIBIOTIC CONSULT NOTE - FOLLOW UP  Pharmacy Consult for Cipro Indication: UTI, colitis  No Known Allergies  Patient Measurements: Height: 6' 2.02" (188 cm) Weight: 178 lb 12.7 oz (81.1 kg) IBW/kg (Calculated) : 82.24   Vital Signs: Temp: 97.6 F (36.4 C) (09/13 0636) Temp Source: Axillary (09/13 0636) BP: 132/50 mmHg (09/13 0636) Pulse Rate: 70 (09/13 0636)  Labs:  Recent Labs  05/24/15 0040 05/24/15 0116 05/25/15 0902 05/26/15 0410  WBC  --  25.6* 9.1 5.9  HGB  --  7.6* 10.8* 11.2*  PLT  --  292 176 190  LABCREA 86.80  --   --   --   CREATININE  --  2.51* 1.87* 1.56*   Estimated Creatinine Clearance: 36.1 mL/min (by C-G formula based on Cr of 1.56).   Assessment: 40 yoM presented to ED on 9/11 with diarrhea, abdominal pain and altered mental status, noted to have GI bleeding and possible UTI and colitis.  Pharmacy consulted to dose Cipro in this septic patient, likely due to UTI.  MD has also ordered Flagyl.  Today, 05/26/2015:  Afebrile  WBC improved to WNL  Renal:  CKD stage 3, SCr improved to 1.56 with CrCl ~ 36 ml/min  Urine culture with GNR - pending final identification.  Blood cultures remain no growth to date.  Goal of Therapy:  Appropriate abx dosing, eradication of infection.   Plan:   Increase to Cipro  IV q12h  Follow up renal function, cultures, and clinical course.  Lynann Beaver PharmD, BCPS Pager [phone] 05/26/2015 10:11 AM

## 2015-05-26 NOTE — Progress Notes (Signed)
[facility] PROGRESS NOTE  Francisco Roberts WUJ:[id number] DOB: [birth year] DOA: 05/24/2015 PCP: Maryelizabeth Rowan, MD  Assessment/Plan: 1. Sepsis-resolved, multifactorial due to UTI, gastroenteritis. Patient has improved, white count is Normal. Continue ciprofloxacin and Flagyl. Will await final urine culture and blood culture results. Urine cultures growing gram-negative rods 50,000 colonies per mL. Blood cultures 2 are negative so far. 2. Anemia- when patient came to the hospital the hemoglobin was 7.6, this morning hemoglobin 11.2 after  2 units PRBC. Will follow CBC in a.m. 3. Small bowel obstruction- patient CT scan showed small bowel obstruction, Gen. surgery was consulted and NG tube was recommended but patient's granddaughter did not want him to put him through discomfort. He was not placed. Patient this morning denies nausea vomiting.Xray of abdomen shows mildly dilated air filled loops of small bowel in the upper abdomen,with interval improvement of the bowel dilatation compared to the prior study. 4. Left hydropneumothorax- CT scan abdomen showed small left hydropneumothorax, patient currently not requiring any oxygen O2 sats are 99% on room air. Swallow eval obtained, and patient put on clear liquid diet. 5. UTI- urine culture growing proteus mirabilis sensitive to  ciprofloxacin. 6. GI bleed- when patient presented hemoglobin was 7.6, FOBT was positive. GI was consulted. The patient was not found to be a candidate for any intervention. This morning hemoglobin is 11.2 We will follow CBC in a.m. we'll start IV Protonix 40 mg daily. 7. Diabetes mellitus- clear liquid diet,  sliding-scale insulin.  8. Acute on CKD stage III- patient doesn't live with creatinine of 2.51 and BUN 66. Patient with and creatinine 1.63 on 04/14/2013. Today creatinine is improved to 1.56 with BUN 41. 9. DVT prophylaxis- SCDs  Code Status: DO NOT RESUSCITATE Family Communication: *Discussed with patient's  granddaughter at bedside Disposition Plan: Skilled nursing facility versus hospice based on clinical improvement. Palliative care following   Consultants:  GI  Surgery  Palliative care  Procedures:  None  Antibiotics:  Ciprofloxacin  Flagyl  HPI/Subjective: 81 year old male with a history of diabetes mellitus, glaucoma, C KD stage III who came with diarrhea, abdominal pain and altered mental status. The ED patient was found to have profound anemia with hemoglobin 7.6, white count 25.6, lactate 7.22 UA was positive for UTI. FOBT was positive. Palliative care was consulted for goals of care. Patient was started on IV ciprofloxacin and Flagyl Urine culture is growing 50,000 colonies of gram-negative rods, blood cultures 2 are negative so far.  Patient denies any complaints.     Objective: Filed Vitals:   05/26/15 0636  BP: 132/50  Pulse: 70  Temp: 97.6 F (36.4 C)  Resp: 18    Intake/Output Summary (Last 24 hours) at 05/26/15 1252 Last data filed at 05/26/15 9562  Gross per 24 hour  Intake      3 ml  Output   1200 ml  Net  -1197 ml   Filed Weights   05/24/15 1251  Weight: 81.1 kg (178 lb 12.7 oz)    Exam:   General:  Appears in no acute distress  Cardiovascular: S1-S2 regular  Respiratory: Clear to auscultation bilaterally  Abdomen: Soft, nontender, no organomegaly  Musculoskeletal: No edema of the lower extremities   Data Reviewed: Basic Metabolic Panel:  Recent Labs Lab 05/24/15 0116 05/25/15 0902 05/26/15 0410  NA 142 142 144  K 4.1 4.2 4.0  CL 107 118* 119*  CO2 21* 19* 19*  GLUCOSE 245* 102* 77  BUN 66* 53* 41*  CREATININE 2.51* 1.87* 1.56*  CALCIUM  8.9 7.9* 8.0*   Liver Function Tests:  Recent Labs Lab 05/24/15 0116 05/25/15 0902 05/26/15 0410  AST 34 17 15  ALT 21 15* 15*  ALKPHOS 91 62 60  BILITOT 0.7 0.5 0.5  PROT 6.6 5.0* 4.9*  ALBUMIN 3.1* 2.3* 2.3*   No results for input(s): LIPASE, AMYLASE in the last 168  hours. No results for input(s): AMMONIA in the last 168 hours. CBC:  Recent Labs Lab 05/24/15 0116 05/25/15 0902 05/26/15 0410  WBC 25.6* 9.1 5.9  NEUTROABS 23.3*  --   --   HGB 7.6* 10.8* 11.2*  HCT 23.0* 32.8* 34.0*  MCV 90.6 90.4 90.7  PLT 292 176 190   Cardiac Enzymes:  Recent Labs Lab 05/24/15 0116  TROPONINI 0.03   BNP (last 3 results) No results for input(s): BNP in the last 8760 hours.  ProBNP (last 3 results) No results for input(s): PROBNP in the last 8760 hours.  CBG:  Recent Labs Lab 05/25/15 1210 05/25/15 1653 05/25/15 2234 05/26/15 0846 05/26/15 1003  GLUCAP 97 73 81 59* 162*    Recent Results (from the past 240 hour(s))  Urine culture     Status: None   Collection Time: 05/24/15 12:40 AM  Result Value Ref Range Status   Specimen Description URINE, RANDOM  Final   Special Requests NONE  Final   Culture   Final    50,000 COLONIES/mL PROTEUS MIRABILIS Performed at [facility]    Report Status 05/26/2015 FINAL  Final   Organism ID, Bacteria PROTEUS MIRABILIS  Final      Susceptibility   Proteus mirabilis - MIC*    AMPICILLIN 4 SENSITIVE Sensitive     CEFAZOLIN 8 SENSITIVE Sensitive     CEFTRIAXONE <=1 SENSITIVE Sensitive     CIPROFLOXACIN <=0.25 SENSITIVE Sensitive     GENTAMICIN <=1 SENSITIVE Sensitive     IMIPENEM >=16 RESISTANT Resistant     NITROFURANTOIN 64 RESISTANT Resistant     TRIMETH/SULFA <=20 SENSITIVE Sensitive     AMPICILLIN/SULBACTAM 4 SENSITIVE Sensitive     PIP/TAZO <=4 SENSITIVE Sensitive     * 50,000 COLONIES/mL PROTEUS MIRABILIS  Culture, blood (routine x 2)     Status: None (Preliminary result)   Collection Time: 05/24/15  2:00 AM  Result Value Ref Range Status   Specimen Description BLOOD LEFT AC  Final   Special Requests BOTTLES DRAWN AEROBIC AND ANAEROBIC 5CC  Final   Culture   Final    NO GROWTH 1 DAY Performed at [facility]    Report Status PENDING  Incomplete  Culture, blood (routine x  2)     Status: None (Preliminary result)   Collection Time: 05/24/15  2:00 AM  Result Value Ref Range Status   Specimen Description BLOOD FOREARM  Final   Special Requests BOTTLES DRAWN AEROBIC AND ANAEROBIC 5CC  Final   Culture   Final    NO GROWTH 1 DAY Performed at [facility]    Report Status PENDING  Incomplete     Studies: Dg Abd 2 Views  05/25/2015   CLINICAL DATA:  81 year old male with abdominal pain and small-bowel obstruction. Follow-up radiograph.  EXAM: ABDOMEN - 2 VIEW  COMPARISON:  CT dated 05/24/2015  FINDINGS: Multiple mildly dilated air-filled loops of small bowel noted in the upper abdomen. Oral contrast from the prior CT is now seen within thecolon. No free air or radiopaque calculi identified. A catheter is noted in the bladder. There is degenerative changes of  the spine.  IMPRESSION: Mildly dilated air-filled loops of small bowel in the upper abdomen with interval improvement of the bowel dilatation compared to the prior study. Oral contrast from prior CT is now seen within the colon. Follow-up recommended.   Electronically Signed   By: Elgie Collard M.D.   On: 05/25/2015 20:48    Scheduled Meds: . ciprofloxacin  400 mg Intravenous Q12H  . dextrose  50 mL Intravenous Once  . insulin aspart  0-9 Units Subcutaneous TID WC  . metronidazole  500 mg Intravenous Q8H  . pantoprazole (PROTONIX) IV  40 mg Intravenous Q24H  . sodium chloride  3 mL Intravenous Q12H   Continuous Infusions: . dextrose 5 % and 0.45% NaCl      Principal Problem:   Sepsis Active Problems:   GIB (gastrointestinal bleeding)   Diabetes mellitus without complication   Glaucoma   UTI (lower urinary tract infection)   Acute encephalopathy   Abdominal pain   Acute renal failure superimposed on stage 3 chronic kidney disease   Elevated lactic acid level   Diarrhea   Acute on chronic renal failure   Bleeding gastrointestinal   Encounter for palliative care    Time spent: 25  min    [facility]  [facility] Pager [phone]*. If 7PM-7AM, please contact night-coverage at [email or web address], password [facility] 05/26/2015, 12:52 PM  LOS: 2 days

## 2015-05-26 NOTE — Care Management Important Message (Signed)
Important Message  Patient Details IM Letter given to Nora/Case Manager to present to PatientImportant Message  Patient Details  Name: Brianna Esson MRN: [id number] Date of Birth: 1921/10/28   Medicare Important Message Given:  Yes-second notification given    Haskell Flirt 05/26/2015, 12:43 PM Name: Pheng Prokop MRN: [id number] Date of Birth: [birth year]   Medicare Important Message Given:  Yes-second notification given    Haskell Flirt 05/26/2015, 12:42 PM

## 2015-05-26 NOTE — Progress Notes (Signed)
Blood Glucose at 0845 /dl. 1 amp D50 given since pt is NPO. MD paged to notify and inform

## 2015-05-26 NOTE — Evaluation (Signed)
Clinical/Bedside Swallow Evaluation Patient Details  Name: Francisco Roberts MRN: [id number] Date of Birth: 1922/07/01  Today's Date: 05/26/2015 Time: SLP Start Time (ACUTE ONLY): 1110 SLP Stop Time (ACUTE ONLY): 1142 SLP Time Calculation (min) (ACUTE ONLY): 32 min  Past Medical History:  Past Medical History  Diagnosis Date  . Diabetes mellitus without complication   . Glaucoma    Past Surgical History: History reviewed. No pertinent past surgical history. HPI:  81 yo male adm to [facility] 05/24/15 with sudden onset of worsening diarrhea, weakness, abdominal pain after eating chinese food.  Pt diagnosed with sepsis and SBO.  Family declined NG placement.  Pt PMH + for UTI, DM, Renal dx stage 3, gluacoma.  Plan for pt to have comfort care.  Pt found to have left hydropneumothorax on CT abdomen 9/12.  CXR 9/11 showed small pleural effusion, diffuse interstitial pattern to the lungs probably fibrosis but can't exclude interstitial process.  Per paliative Md Anwar, prognosis was hours to days.  Swallow evaluation ordered.    Assessment / Plan / Recommendation Clinical Impression  Pt presents with overall intact swallow function - negative CN exam noted.  No s/s of aspiration with jello, juice and water via cup.  Minimal delay in oral transiting noted. Suspect intact pharyngeal swallow.  Did not test solids due to pt's GI issue - but granddaughter reports pt eats anything at home.  He does have only a single lower tooth and therefore would not recommend a regular diet when advance - dys3 likely tolerated well.   Educated pt's granddaughter to aspiration precautions to mitigate risk using teach back for reinforcement.       Aspiration Risk  Mild    Diet Recommendation Thin (clears)   Medication Administration: Other (Comment) (as tolerated) Compensations: Slow rate;Small sips/bites;Follow solids with liquid    Other  Recommendations Oral Care Recommendations: Oral care BID   Follow Up Recommendations        Frequency and Duration min 2x/week  1 week   Pertinent Vitals/Pain Afebrile, decreased      Swallow Study Prior Functional Status   see hhx, pt and family deny pt with h/o dysphagia    General Date of Onset: 05/26/15 Other Pertinent Information: 81 yo male adm to [facility] 05/24/15 with sudden onset of worsening diarrhea, weakness, abdominal pain after eating chinese food.  Pt diagnosed with sepsis and SBO.  Family declined NG placement.  Pt PMH + for UTI, DM, Renal dx stage 3, gluacoma.  Plan for pt to have comfort care.  Pt found to have left hydropneumothorax on CT abdomen 9/12.  CXR 9/11 showed small pleural effusion, diffuse interstitial pattern to the lungs probably fibrosis but can't exclude interstitial process.  Per paliative Md Anwar, prognosis was hours to days.  Swallow evaluation ordered.  Type of Study: Bedside swallow evaluation Diet Prior to this Study: NPO Temperature Spikes Noted: No Respiratory Status: Room air History of Recent Intubation: No Behavior/Cognition: Alert;Cooperative;Pleasant mood;Other (Comment) (HOH) Oral Cavity - Dentition: Missing dentition (single lower frontal tooth) Self-Feeding Abilities: Able to feed self Patient Positioning: Upright in bed Baseline Vocal Quality: Low vocal intensity Volitional Cough: Strong Volitional Swallow: Able to elicit    Oral/Motor/Sensory Function Overall Oral Motor/Sensory Function: Appears within functional limits for tasks assessed (except generalized weakness)   Ice Chips Ice chips: Within functional limits Presentation: Spoon;Self Fed   Thin Liquid Thin Liquid: Impaired Presentation: Cup;Self Fed;Spoon;Straw Oral Phase Functional Implications: Oral holding Pharyngeal  Phase Impairments: Cough - Immediate Other Comments:  minimal oral holding, overt aspiration of thin water via straw - excellent tolerance of thin via cup    Nectar Thick Nectar Thick Liquid: Not tested   Honey Thick Honey Thick Liquid: Not tested    Puree Puree: Within functional limits Presentation: Self Fed;Spoon Other Comments: jello   Solid   GO    Solid: Not tested Other Comments: dnt secondary to bowel obstruction possible       Mills Koller, MS [facility] SLP [phone]

## 2015-05-26 NOTE — Progress Notes (Signed)
PT Cancellation Note  Patient Details Name: [name] MRN: [id number] DOB: [birth year]   Cancelled Treatment:    Reason Eval/Treat Not Completed: Other (comment) (chart notes reviewed, Palliative care involved, notes indicate  moving towards Comfort. Will check back this PM for updated  GOC. Evaluate if  not comfort care .)   Rada Hay 05/26/2015, 9:28 AM Blanchard Kelch PT [phone]

## 2015-05-27 DIAGNOSIS — E119 Type 2 diabetes mellitus without complications: Secondary | ICD-10-CM

## 2015-05-27 DIAGNOSIS — N189 Chronic kidney disease, unspecified: Secondary | ICD-10-CM

## 2015-05-27 DIAGNOSIS — Z7189 Other specified counseling: Secondary | ICD-10-CM

## 2015-05-27 DIAGNOSIS — R109 Unspecified abdominal pain: Secondary | ICD-10-CM

## 2015-05-27 DIAGNOSIS — A419 Sepsis, unspecified organism: Principal | ICD-10-CM

## 2015-05-27 DIAGNOSIS — Z515 Encounter for palliative care: Secondary | ICD-10-CM

## 2015-05-27 DIAGNOSIS — R531 Weakness: Secondary | ICD-10-CM

## 2015-05-27 DIAGNOSIS — G9340 Encephalopathy, unspecified: Secondary | ICD-10-CM

## 2015-05-27 LAB — COMPREHENSIVE METABOLIC PANEL
ALT: 14 U/L — ABNORMAL LOW (ref 17–63)
AST: 18 U/L (ref 15–41)
Albumin: 2.3 g/dL — ABNORMAL LOW (ref 3.5–5.0)
Alkaline Phosphatase: 63 U/L (ref 38–126)
Anion gap: 5 (ref 5–15)
BILIRUBIN TOTAL: 0.4 mg/dL (ref 0.3–1.2)
BUN: 29 mg/dL — AB (ref 6–20)
CO2: 19 mmol/L — ABNORMAL LOW (ref 22–32)
Calcium: 7.9 mg/dL — ABNORMAL LOW (ref 8.9–10.3)
Chloride: 114 mmol/L — ABNORMAL HIGH (ref 101–111)
Creatinine, Ser: 1.21 mg/dL (ref 0.61–1.24)
GFR calc Af Amer: 59 mL/min — ABNORMAL LOW (ref >60)
GFR, EST NON AFRICAN AMERICAN: 51 mL/min — AB (ref >60)
Glucose, Bld: 119 mg/dL — ABNORMAL HIGH (ref 65–99)
POTASSIUM: 3.7 mmol/L (ref 3.5–5.1)
Sodium: 138 mmol/L (ref 135–145)
TOTAL PROTEIN: 5.1 g/dL — AB (ref 6.5–8.1)

## 2015-05-27 LAB — GLUCOSE, CAPILLARY
GLUCOSE-CAPILLARY: 165 mg/dL — AB (ref 65–99)
GLUCOSE-CAPILLARY: 125 mg/dL — AB (ref 65–99)
Glucose-Capillary: 108 mg/dL — ABNORMAL HIGH (ref 65–99)
Glucose-Capillary: 228 mg/dL — ABNORMAL HIGH (ref 65–99)

## 2015-05-27 LAB — CBC
HEMATOCRIT: 35.3 % — AB (ref 39.0–52.0)
Hemoglobin: 11.6 g/dL — ABNORMAL LOW (ref 13.0–17.0)
MCH: 29.7 pg (ref 26.0–34.0)
MCHC: 32.9 g/dL (ref 30.0–36.0)
MCV: 90.3 fL (ref 78.0–100.0)
Platelets: 205 10*3/uL (ref 150–400)
RBC: 3.91 MIL/uL — ABNORMAL LOW (ref 4.22–5.81)
RDW: 16.4 % — AB (ref 11.5–15.5)
WBC: 4.9 10*3/uL (ref 4.0–10.5)

## 2015-05-27 MED ORDER — CIPROFLOXACIN HCL 500 MG PO TABS
500.0000 mg | ORAL_TABLET | Freq: Two times a day (BID) | ORAL | Status: DC
  Administered 2015-05-27 – 2015-05-28 (×2): 500 mg via ORAL
  Filled 2015-05-27 (×2): qty 1

## 2015-05-27 MED ORDER — METRONIDAZOLE 500 MG PO TABS
500.0000 mg | ORAL_TABLET | Freq: Three times a day (TID) | ORAL | Status: DC
  Administered 2015-05-27 – 2015-05-28 (×2): 500 mg via ORAL
  Filled 2015-05-27 (×2): qty 1

## 2015-05-27 MED ORDER — RESOURCE THICKENUP CLEAR PO POWD
ORAL | Status: DC | PRN
  Filled 2015-05-27: qty 125

## 2015-05-27 MED ORDER — PANTOPRAZOLE SODIUM 40 MG PO TBEC
40.0000 mg | DELAYED_RELEASE_TABLET | Freq: Every day | ORAL | Status: DC
  Administered 2015-05-27 – 2015-05-28 (×2): 40 mg via ORAL
  Filled 2015-05-27 (×2): qty 1

## 2015-05-27 NOTE — Progress Notes (Signed)
Speech Language Pathology Treatment: Dysphagia  Patient Details Name: Donterius Filley MRN: [id number] DOB: 10-28-1921 Today's Date: 05/27/2015 Time: 7829-5621 SLP Time Calculation (min) (ACUTE ONLY): 21 min  Assessment / Plan / Recommendation Clinical Impression  Pt today noted to have frequent coughing with breakfast per nurse tech  - with thin liquids more than other consistencies.  Pt fully alert, sitting upright in chair and demonstrating clear voice.  Observed mildly delayed swallow across consistencies and consistent cough immediate post swallow of thin - that worsened as intake progressed.  Productive cough x2 noted = to clear white tinged frothy secretions- ? Pharyngeal secretions and/or esophageal.  Much better tolerance noted with nectar thick liquid and pt admitted to increased comfort with swallowing vs thin.  Will modify liquids temporarily to nectar for maximal pt comfort - anticipate liquid dietary advancement with improved medical status.  Informed RN and modified posted signs in pt's room for care plan.  Will follow up for family education, tolerance.    HPI Other Pertinent Information: 81 yo male adm to [facility] 05/24/15 with sudden onset of worsening diarrhea, weakness, abdominal pain after eating chinese food.  Pt diagnosed with sepsis and SBO.  Family declined NG placement.  Pt PMH + for UTI, DM, Renal dx stage 3, gluacoma.  Plan for pt to have comfort care.  Pt found to have left hydropneumothorax on CT abdomen 9/12.  CXR 9/11 showed small pleural effusion, diffuse interstitial pattern to the lungs probably fibrosis but can't exclude interstitial process.  Per paliative Md Anwar, prognosis was hours to days.  Swallow evaluation ordered.    Pertinent Vitals Pain Assessment: No/denies pain  SLP Plan  Continue with current plan of care    Recommendations Diet recommendations: Nectar-thick liquid;Other(comment) (ice ok) Liquids provided via: Cup;No straw;Teaspoon Supervision: Patient able to  self feed;Full supervision/cueing for compensatory strategies Compensations: Slow rate;Small sips/bites (encourage cough and expectoration as needed) Postural Changes and/or Swallow Maneuvers: Seated upright 90 degrees;Upright 30-60 min after meal              Oral Care Recommendations: Oral care before and after PO Follow up Recommendations: Other (comment) (tbd) Plan: Continue with current plan of care    GO     Donavan Burnet, MS [facility] SLP [phone]

## 2015-05-27 NOTE — Evaluation (Signed)
Physical Therapy Evaluation Patient Details Name: Francisco Roberts MRN: [id number] DOB: April 03, 1922 Today's Date: 05/27/2015   History of Present Illness  81 yo male adm to [facility] 05/24/15 with sudden onset of worsening diarrhea, weakness, abdominal pain after eating chinese food. Pt diagnosed with sepsis and SBO. Family declined NG placement. Pt PMH + for UTI, DM, Renal dx stage 3, gluacoma. Plan for pt to have comfort care. Pt found to have left hydropneumothorax on CT abdomen 9/12. CXR 9/11 showed small pleural effusion, diffuse interstitial pattern to the lungs probably fibrosis but can't exclude interstitial process. Per paliative Md Anwar, prognosis was hours to days.  Clinical Impression  Patient is very pleasant and appreciative of helping him. Patient was able to participate in  Mobiilty, unable to stand safely with only 1 assist. Assisted to recliner with squat pivot, max assist. Patient will benefit from PT to address problems listed in note below.  Follow Up Recommendations SNF;Supervision/Assistance - 24 hour    Equipment Recommendations  None recommended by PT    Recommendations for Other Services       Precautions / Restrictions Precautions Precautions: Fall      Mobility  Bed Mobility Overal bed mobility: Needs Assistance Bed Mobility: Supine to Sit     Supine to sit: Mod assist     General bed mobility comments: patient able to move legs over edge ,assisted with trunk to upright, assisted  to scoot to the edge.  Transfers Overall transfer level: Needs assistance   Transfers: Sit to/from Stand;Squat Pivot Transfers     Squat pivot transfers: Max assist     General transfer comment: attempted standing, did not feel safe to perform without +2, squat pivot to recliner in 3 increments to get fully in chair  Ambulation/Gait                Stairs            Wheelchair Mobility    Modified Rankin (Stroke Patients Only)       Balance Overall  balance assessment: Needs assistance Sitting-balance support: Feet supported;No upper extremity supported Sitting balance-Leahy Scale: Fair     Standing balance support: During functional activity;Bilateral upper extremity supported Standing balance-Leahy Scale: Poor                               Pertinent Vitals/Pain Pain Assessment: No/denies pain    Home Living Family/patient expects to be discharged to:: Private residence Living Arrangements: Other relatives               Additional Comments: chart states granddaughter primary caregiver, no known functional level PTA, no family present.    Prior Function           Comments: unknown     Hand Dominance        Extremity/Trunk Assessment   Upper Extremity Assessment: RUE deficits/detail;LUE deficits/detail RUE Deficits / Details: contractures of the fingers, placed a washcloth roll in palm, already had Allevyn pad in palm. decreased elbow and shoulder     LUE Deficits / Details: more  active hand ROM, palm not as fisted, was eating with R hand.   Lower Extremity Assessment: RLE deficits/detail;LLE deficits/detail RLE Deficits / Details: did not stand  and weight bear but did extend knee after sitting  up. LLE Deficits / Details: similar to R  Cervical / Trunk Assessment: Kyphotic  Communication      Cognition Arousal/Alertness: Awake/alert  Overall Cognitive Status: No family/caregiver present to determine baseline cognitive functioning Area of Impairment: Orientation               General Comments: patient did not answer any questions but was very appreciative and thanked the PT several times.    General Comments      Exercises        Assessment/Plan    PT Assessment Patient needs continued PT services  PT Diagnosis Generalized weakness;Altered mental status   PT Problem List Decreased strength;Decreased range of motion;Decreased activity tolerance;Decreased  mobility;Decreased cognition;Decreased skin integrity  PT Treatment Interventions Functional mobility training;Therapeutic activities;Therapeutic exercise;Patient/family education   PT Goals (Current goals can be found in the Care Plan section) Acute Rehab PT Goals PT Goal Formulation: Patient unable to participate in goal setting Time For Goal Achievement: 06/10/15 Potential to Achieve Goals: Fair    Frequency Min 3X/week   Barriers to discharge        Co-evaluation               End of Session Equipment Utilized During Treatment: Gait belt Activity Tolerance: Patient tolerated treatment well Patient left: in chair;with call bell/phone within reach;with chair alarm set Nurse Communication: Mobility status         Time: 9604-5409 PT Time Calculation (min) (ACUTE ONLY): 33 min   Charges:   PT Evaluation $Initial PT Evaluation Tier I: 1 Procedure PT Treatments $Therapeutic Activity: 8-22 mins   PT G Codes:        Rada Hay 05/27/2015, 9:00 AM Blanchard Kelch PT [phone]

## 2015-05-27 NOTE — Progress Notes (Signed)
CSW received referral for questionable SNF.   CSW received update from attending MD and PMT NP, Lorinda Creed that pt granddaughter wishes for pt to return home with assistance from pt granddaughter.  RNCM aware and assisting with hospice in the home arrangements.  No further social work needs identified at this time.  CSW signing off.   Please re-consult if social work needs arise.  Loletta Specter, MSW, LCSW Clinical Social Work [phone]

## 2015-05-27 NOTE — Progress Notes (Signed)
[facility] PROGRESS NOTE  Francisco Roberts ZOX:[id number] DOB: [birth year] DOA: 05/24/2015 PCP: Maryelizabeth Rowan, MD  Assessment/Plan: 1. Sepsis- resolved, multifactorial due to UTI, gastroenteritis. Patient has improved, white count is Normal. Continue ciprofloxacin and Flagyl. Will await final urine culture and blood culture results. Urine cultures growing gram-negative rods 50,000 colonies per mL. Blood cultures 2 are negative so far.        Will transition to oral ciprofloxacin and Flagyl. Clinically he is improving, out of bed to chair, awake and alert interacting       with family members. 2. Anemia- when patient came to the hospital the hemoglobin was 7.6, this morning hemoglobin 11.2 after  2 units PRBC. Hemoglobin remained stable at 11.6. 3. Small bowel obstruction- patient CT scan showed small bowel obstruction, Gen. surgery was consulted and NG tube was recommended but patient's granddaughter did not want him to put him through discomfort. He was not placed. Patient this morning denies nausea vomiting.Xray of abdomen shows mildly dilated air filled loops of small bowel in the upper abdomen,with interval improvement of the bowel dilatation compared to the prior study. Patient tolerating clears so far advance diet in a.m. 4. Left hydropneumothorax- CT scan abdomen showed small left hydropneumothorax, patient currently not requiring any oxygen O2 sats are 99% on room air. Swallow eval obtained, and patient put on clear liquid diet. 5. UTI- urine culture growing proteus mirabilis sensitive to  Ciprofloxacin. Plan for 7 days of antimicrobial therapy 6. GI bleed- when patient presented hemoglobin was 7.6, FOBT was positive. GI was consulted. The patient was not found to be a candidate for any intervention. Will transition to oral Protonix. 7. Diabetes mellitus- clear liquid diet,  sliding-scale insulin.  8. Acute on CKD stage III- patient doesn't live with creatinine of 2.51 and BUN 66. Patient with  and creatinine 1.63 on 04/14/2013. Today creatinine is improved to 1.56 with BUN 41. 9. DVT prophylaxis- SCDs  Code Status: DO NOT RESUSCITATE Family Communication:  I spoke with her granddaughter Disposition Plan:  Granddaughter wishing to take him home    Consultants:  GI  Surgery  Palliative care  Procedures:  None  Antibiotics:  Ciprofloxacin  Flagyl  HPI/Subjective: 81 year old male with a history of diabetes mellitus, glaucoma, C KD stage III who came with diarrhea, abdominal pain and altered mental status. The ED patient was found to have profound anemia with hemoglobin 7.6, white count 25.6, lactate 7.22 UA was positive for UTI. FOBT was positive. Palliative care was consulted for goals of care. Patient was started on IV ciprofloxacin and Flagyl Urine culture is growing 50,000 colonies of gram-negative rods, blood cultures 2 are negative so far.     Objective: Filed Vitals:   05/27/15 1420  BP: 127/55  Pulse: 65  Temp: 98.6 F (37 C)  Resp: 18    Intake/Output Summary (Last 24 hours) at 05/27/15 1532 Last data filed at 05/27/15 0543  Gross per 24 hour  Intake    240 ml  Output    625 ml  Net   -385 ml   Filed Weights   05/24/15 1251  Weight: 81.1 kg (178 lb 12.7 oz)    Exam:   General:  Appears in no acute distress  Cardiovascular: S1-S2 regular  Respiratory: Clear to auscultation bilaterally  Abdomen: Soft, nontender, no organomegaly  Musculoskeletal: No edema of the lower extremities   Data Reviewed: Basic Metabolic Panel:  Recent Labs Lab 05/24/15 0116 05/25/15 0902 05/26/15 0410 05/27/15 0407  NA 142 142  144 138  K 4.1 4.2 4.0 3.7  CL 107 118* 119* 114*  CO2 21* 19* 19* 19*  GLUCOSE 245* 102* 77 119*  BUN 66* 53* 41* 29*  CREATININE 2.51* 1.87* 1.56* 1.21  CALCIUM 8.9 7.9* 8.0* 7.9*   Liver Function Tests:  Recent Labs Lab 05/24/15 0116 05/25/15 0902 05/26/15 0410 05/27/15 0407  AST 34 17 15 18   ALT 21 15*  15* 14*  ALKPHOS 91 62 60 63  BILITOT 0.7 0.5 0.5 0.4  PROT 6.6 5.0* 4.9* 5.1*  ALBUMIN 3.1* 2.3* 2.3* 2.3*   No results for input(s): LIPASE, AMYLASE in the last 168 hours. No results for input(s): AMMONIA in the last 168 hours. CBC:  Recent Labs Lab 05/24/15 0116 05/25/15 0902 05/26/15 0410 05/27/15 0407  WBC 25.6* 9.1 5.9 4.9  NEUTROABS 23.3*  --   --   --   HGB 7.6* 10.8* 11.2* 11.6*  HCT 23.0* 32.8* 34.0* 35.3*  MCV 90.6 90.4 90.7 90.3  PLT 292 176 190 205   Cardiac Enzymes:  Recent Labs Lab 05/24/15 0116  TROPONINI 0.03   BNP (last 3 results) No results for input(s): BNP in the last 8760 hours.  ProBNP (last 3 results) No results for input(s): PROBNP in the last 8760 hours.  CBG:  Recent Labs Lab 05/26/15 1358 05/26/15 1749 05/26/15 2137 05/27/15 0754 05/27/15 1153  GLUCAP 163* 120* 138* 108* 228*    Recent Results (from the past 240 hour(s))  Urine culture     Status: None   Collection Time: 05/24/15 12:40 AM  Result Value Ref Range Status   Specimen Description URINE, RANDOM  Final   Special Requests NONE  Final   Culture   Final    50,000 COLONIES/mL PROTEUS MIRABILIS Performed at [facility]    Report Status 05/26/2015 FINAL  Final   Organism ID, Bacteria PROTEUS MIRABILIS  Final      Susceptibility   Proteus mirabilis - MIC*    AMPICILLIN 4 SENSITIVE Sensitive     CEFAZOLIN 8 SENSITIVE Sensitive     CEFTRIAXONE <=1 SENSITIVE Sensitive     CIPROFLOXACIN <=0.25 SENSITIVE Sensitive     GENTAMICIN <=1 SENSITIVE Sensitive     IMIPENEM >=16 RESISTANT Resistant     NITROFURANTOIN 64 RESISTANT Resistant     TRIMETH/SULFA <=20 SENSITIVE Sensitive     AMPICILLIN/SULBACTAM 4 SENSITIVE Sensitive     PIP/TAZO <=4 SENSITIVE Sensitive     * 50,000 COLONIES/mL PROTEUS MIRABILIS  Culture, blood (routine x 2)     Status: None (Preliminary result)   Collection Time: 05/24/15  2:00 AM  Result Value Ref Range Status   Specimen Description  BLOOD LEFT AC  Final   Special Requests BOTTLES DRAWN AEROBIC AND ANAEROBIC 5CC  Final   Culture   Final    NO GROWTH 2 DAYS Performed at [facility]    Report Status PENDING  Incomplete  Culture, blood (routine x 2)     Status: None (Preliminary result)   Collection Time: 05/24/15  2:00 AM  Result Value Ref Range Status   Specimen Description BLOOD FOREARM  Final   Special Requests BOTTLES DRAWN AEROBIC AND ANAEROBIC 5CC  Final   Culture   Final    NO GROWTH 2 DAYS Performed at [facility]    Report Status PENDING  Incomplete     Studies: Dg Abd 2 Views  05/25/2015   CLINICAL DATA:  81 year old male with abdominal pain and small-bowel obstruction.  Follow-up radiograph.  EXAM: ABDOMEN - 2 VIEW  COMPARISON:  CT dated 05/24/2015  FINDINGS: Multiple mildly dilated air-filled loops of small bowel noted in the upper abdomen. Oral contrast from the prior CT is now seen within thecolon. No free air or radiopaque calculi identified. A catheter is noted in the bladder. There is degenerative changes of the spine.  IMPRESSION: Mildly dilated air-filled loops of small bowel in the upper abdomen with interval improvement of the bowel dilatation compared to the prior study. Oral contrast from prior CT is now seen within the colon. Follow-up recommended.   Electronically Signed   By: Elgie Collard M.D.   On: 05/25/2015 20:48    Scheduled Meds: . ciprofloxacin  400 mg Intravenous Q12H  . insulin aspart  0-9 Units Subcutaneous TID WC  . metronidazole  500 mg Intravenous Q8H  . pantoprazole (PROTONIX) IV  40 mg Intravenous Q24H  . sodium chloride  3 mL Intravenous Q12H   Continuous Infusions: . dextrose 5 % and 0.45% NaCl 75 mL/hr at 05/27/15 0400    Principal Problem:   Sepsis Active Problems:   GIB (gastrointestinal bleeding)   Diabetes mellitus without complication   Glaucoma   UTI (lower urinary tract infection)   Acute encephalopathy   Abdominal pain   Acute renal  failure superimposed on stage 3 chronic kidney disease   Elevated lactic acid level   Diarrhea   Acute on chronic renal failure   Bleeding gastrointestinal   Encounter for palliative care   Palliative care encounter   DNR (do not resuscitate) discussion   Weakness generalized    Time spent: 20 min    Jeralyn Bennett  [facility] Pager [phone]. If 7PM-7AM, please contact night-coverage at [email or web address], password [facility] 05/27/2015, 3:32 PM  LOS: 3 days

## 2015-05-27 NOTE — Care Management Note (Signed)
Case Management Note  Patient Details  Name: Naziah Weckerly MRN: [id number] Date of Birth: 1921-11-21  Subjective/Objective:            81 yo admitted with Sepsis        Action/Plan: From home with granddaughter  Expected Discharge Date:  05/28/15               Expected Discharge Plan:  Home w Hospice Care  In-House Referral:     Discharge planning Services  CM Consult  Post Acute Care Choice:  Hospice Choice offered to:  Adult Children  DME Arranged:    DME Agency:     HH Arranged:  Disease Management [place] Agency:  [place]  Status of Service:  In process, will continue to follow  Medicare Important Message Given:  Yes-second notification given Date Medicare IM Given:    Medicare IM give by:    Date Additional Medicare IM Given:    Additional Medicare Important Message give by:     If discussed at [place] of Stay Meetings, dates discussed:    Additional Comments: Met with pt and granddaughter at bedside to discuss disposition planning. Home with hospice services was decided. Choice was offered for home hospice providers and [facility] Referral called to [facility]. No equipment is needed at this time per granddaughter. CM will continue to follow. Lynnell Catalan, RN 05/27/2015, 4:27 PM

## 2015-05-27 NOTE — Progress Notes (Signed)
Daily Progress Note   Patient Name: Francisco Roberts       Date: 05/27/2015 DOB: [birth year]  Age: 81 y.o. MRN#: [id number] Attending Physician: Jeralyn Bennett, MD Primary Care Physician: Maryelizabeth Rowan, MD Admit Date: 05/24/2015  Reason for Consultation/Follow-up: Establishing goals of care, Non pain symptom management, Pain control and Psychosocial/spiritual support  Subjective:     -f/u conversation with grand-daughter Cala Bradford  goals of care and options, patient is close to being medically stable for discahrge   -we discussed the concept of failure to thrive and the progression of demtia, she verbalizes an understanding of long term poor prognosis but at this time remains hopeful for improvement and return to home  -discussed hospice benefit and services    Length of Stay: 3 days  Current Medications: Scheduled Meds:  . ciprofloxacin  400 mg Intravenous Q12H  . insulin aspart  0-9 Units Subcutaneous TID WC  . metronidazole  500 mg Intravenous Q8H  . pantoprazole (PROTONIX) IV  40 mg Intravenous Q24H  . sodium chloride  3 mL Intravenous Q12H    Continuous Infusions: . dextrose 5 % and 0.45% NaCl 75 mL/hr at 05/27/15 0400    PRN Meds: acetaminophen **OR** acetaminophen, hydrOXYzine, morphine injection, RESOURCE THICKENUP CLEAR  Palliative Performance Scale: 30 %     Vital Signs: BP 119/53 mmHg  Pulse 67  Temp(Src) 97.3 F (36.3 C) (Oral)  Resp 20  Ht 6' 2.02" (1.88 m)  Wt 81.1 kg (178 lb 12.7 oz)  BMI 22.95 kg/m2  SpO2 98% SpO2: SpO2: 98 % O2 Device: O2 Device: Not Delivered O2 Flow Rate: O2 Flow Rate (L/min): 2 L/min  Intake/output summary:   Intake/Output Summary (Last 24 hours) at 05/27/15 1123 Last data filed at 05/27/15 0543  Gross per 24 hour  Intake    240 ml  Output    625 ml  Net   -385 ml   LBM: Last BM Date: 05/24/15 Baseline Weight: Weight: 81.1 kg (178 lb 12.7 oz) Most recent weight: Weight: 81.1 kg (178 lb 12.7 oz)  Physical Exam:       General: NAD,  HEENT: moist buccal membranes CVS:RRR Resp: decreased in bases Abd: soft NT +BS Skin:warm and dry   Neuro:   Additional Data Reviewed: Recent Labs     05/26/15  0410  05/27/15  0407  WBC  5.9  4.9  HGB  11.2*  11.6*  PLT  190  205  NA  144  138  BUN  41*  29*  CREATININE  1.56*  1.21     Problem List:  Patient Active Problem List   Diagnosis Date Noted  . Palliative care encounter 05/27/2015  . DNR (do not resuscitate) discussion 05/27/2015  . Weakness generalized 05/27/2015  . GIB (gastrointestinal bleeding) 05/24/2015  . UTI (lower urinary tract infection) 05/24/2015  . Sepsis 05/24/2015  . Acute encephalopathy 05/24/2015  . Diarrhea 05/24/2015  . Diabetes mellitus without complication   . Glaucoma   . Abdominal pain   . Acute renal failure superimposed on stage 3 chronic kidney disease   . Elevated lactic acid level   . Acute on chronic renal failure   . Bleeding gastrointestinal   . Encounter for palliative care      Palliative Care Assessment & Plan    Code Status:  DNR  Goals of Care:  Treat the treatable, family hopeful to return to baseline and return home.  Hospice to continue conversation regarding on going GOC once home  Desire for further Chaplaincy support:no-strong community church support   Symptom Management:  Weakness:  Pt evaluation and treatment     Prognosis: < 6 months  5. Discharge Planning: Home with Hospice, if eligible at discharge, will continue to support   Care plan was discussed with Dr Vanessa Barbara and Rulon Abide LCSW  Thank you for allowing the Palliative Medicine Team to assist in the care of this patient.   Time In: 1600 Time Out: 1620 Total Time 20 min Prolonged Time Billed  no     Greater than 50%  of this time was spent counseling and coordinating care related to the above assessment and plan.     Canary Brim, NP  05/27/2015, 11:23 AM  Please contact Palliative Medicine Team  phone at [phone] for questions and concerns.

## 2015-05-28 DIAGNOSIS — N183 Chronic kidney disease, stage 3 (moderate): Secondary | ICD-10-CM

## 2015-05-28 DIAGNOSIS — N390 Urinary tract infection, site not specified: Secondary | ICD-10-CM

## 2015-05-28 DIAGNOSIS — N179 Acute kidney failure, unspecified: Secondary | ICD-10-CM

## 2015-05-28 LAB — GLUCOSE, CAPILLARY
GLUCOSE-CAPILLARY: 224 mg/dL — AB (ref 65–99)
Glucose-Capillary: 109 mg/dL — ABNORMAL HIGH (ref 65–99)

## 2015-05-28 MED ORDER — CIPROFLOXACIN HCL 500 MG PO TABS: 500.0000 mg | ORAL_TABLET | Freq: Two times a day (BID) | ORAL | Status: DC

## 2015-05-28 NOTE — Progress Notes (Signed)
CSW received notification from RN that pt needing ambulance transport home.  CSW confirmed address with pt granddaughter at bedside.   CSW arranged ambulance transport.  No further social work needs identified at this time.  CSW signing off.   Loletta Specter, MSW, LCSW Clinical Social Work [phone]

## 2015-05-28 NOTE — Progress Notes (Addendum)
Discontinued fluids and removed foley catheter

## 2015-05-28 NOTE — Discharge Summary (Signed)
Physician Discharge Summary  Makhari Dovidio ZOX:[id number] DOB: 1922-01-10 DOA: 05/24/2015  PCP: Maryelizabeth Rowan, MD  Admit date: 05/24/2015 Discharge date: 05/28/2015  Time spent: 35 minutes  Recommendations for Outpatient Follow-up:  1.  Patient discharged home with home hospice services 2. Please follow-up on UTI   Discharge Diagnoses:  Principal Problem:   Sepsis Active Problems:   GIB (gastrointestinal bleeding)   Diabetes mellitus without complication   Glaucoma   UTI (lower urinary tract infection)   Acute encephalopathy   Abdominal pain   Acute renal failure superimposed on stage 3 chronic kidney disease   Elevated lactic acid level   Diarrhea   Acute on chronic renal failure   Bleeding gastrointestinal   Encounter for palliative care   Palliative care encounter   DNR (do not resuscitate) discussion   Weakness generalized   Discharge Condition: Stable  Diet recommendation: Regular diet  Filed Weights   05/24/15 1251  Weight: 81.1 kg (178 lb 12.7 oz)    History of present illness:  Francisco Roberts is a 81 y.o. male with PMH of diet-controlled diabetes, glaucoma,CKD-III, who presents with diarrhea, abdominal pain and AMS.  Patient has AMS and is unable to provide medical history, therefore, most of the history is obtained by discussing the case with ED physician and his granddaughter. It seems that patient started having abdominal pain since the Friday short after he took some [place] food. He did not have nausea, vomiting. He did not have diarrhea until today. He had one large bowel movement with loose stool today. Not sure whether the patient had blood in stool per her granddaughter. Patient feels very weak, and is confused. He still does not have nausea or vomiting. Not sure whether patient has symptoms of a UTI. He moves all extremities. Does not complain of chest pain, shortness of breath and cough.   In ED, patient was found to have hemoglobin dropped from 15.2 on  04/14/13-->7.6, WBC 25.6, lactate 7.22, temperature normal, tachycardia, worsening renal function, positive urinalysis for UTI, positive FOBT. X-ray pending.   Hospital Course:  Patient is a pleasant 81 year old gentleman with past medical history of type 2 diabetes mellitus, stage II chronic kidney disease, admitted to the medicine service on 05/24/2015 when he presented with complaints of diarrhea associate with abdominal pain. He was also present with acute mental status likely secondary to delirium in setting of underlying infectious process. Labs showed presence of urinary tract infection as well as acute anemia having hemoglobin of 7.64 visit was transfuse with packed red blood cells. He was treated with empiric IV antimicrobial therapy with ciprofloxacin and Flagyl. CT scan of abdomen revealed diffuse distention of stomach and small bowel with transition zone to decompressed terminal ileum. It was felt the findings reflect a small bowel obstruction. Over the following days patient showed gradual clinical improvement as his diet was advanced. He seemed to have significant improvement with the administration of IV fluids and antimicrobial therapy. Given advanced age and multiple comorbidities palliative care was consulted to facilitate discussion of goals of care. Family members felt that he would not have wanted to undergo cardiopulmonary resuscitation. They feel that he would be happiest at home and felt comfortable taking care of him. They expressed interest in the services of home hospice as they wished to focus his care on comfort. Social work was consulted, he was set up with home hospice prior to discharge. Patient was discharged in stable condition on 05/28/2015.     Consultations:  Palliative care  Discharge Exam: Filed Vitals:   05/28/15 0550  BP: 105/75  Pulse: 70  Temp: 97.3 F (36.3 C)  Resp: 16     General: Appears in no acute distress, assisted out of bed to  chair  Cardiovascular: S1-S2 regular  Respiratory: Clear to auscultation bilaterally  Abdomen: Soft, nontender, no organomegaly  Musculoskeletal: No edema of the lower extremities  Discharge Instructions   Discharge Instructions    Call MD for:  difficulty breathing, headache or visual disturbances    Complete by:  As directed      Call MD for:  extreme fatigue    Complete by:  As directed      Call MD for:  hives    Complete by:  As directed      Call MD for:  persistant dizziness or light-headedness    Complete by:  As directed      Call MD for:  persistant nausea and vomiting    Complete by:  As directed      Call MD for:  redness, tenderness, or signs of infection (pain, swelling, redness, odor or green/yellow discharge around incision site)    Complete by:  As directed      Call MD for:  severe uncontrolled pain    Complete by:  As directed      Call MD for:  temperature >100.4    Complete by:  As directed      Call MD for:    Complete by:  As directed      Diet - low sodium heart healthy    Complete by:  As directed      Increase activity slowly    Complete by:  As directed           Current Discharge Medication List    START taking these medications   Details  ciprofloxacin (CIPRO) 500 MG tablet Take 1 tablet (500 mg total) by mouth 2 (two) times daily. Qty: 10 tablet, Refills: 0      CONTINUE these medications which have NOT CHANGED   Details  DM-Phenylephrine-Acetaminophen (TYLENOL COLD HEAD CONGESTION) 10-5-325 MG TABS Take 2 tablets by mouth once.       No Known Allergies Follow-up Information    Follow [facility] with [facility].   Specialty:  Hospice Services   Contact information:   [address] [place] [address] [phone]       Follow up with [facility], MD In 2 weeks.   Specialty:  Family Medicine   Contact information:   [address] [place] [address] [phone]        The results of  significant diagnostics from this hospitalization (including imaging, microbiology, ancillary and laboratory) are listed below for reference.    Significant Diagnostic Studies: Ct Abdomen Pelvis Wo Contrast  05/24/2015   CLINICAL DATA:  Diarrhea and pain tonight with weakness and reported fall. Pain began after eating [place] food.  EXAM: CT ABDOMEN AND PELVIS WITHOUT CONTRAST  TECHNIQUE: Multidetector CT imaging of the abdomen and pelvis was performed following the standard protocol without IV contrast.  COMPARISON:  None.  FINDINGS: Small left pleural effusion with consolidation in the left lung base. This may indicate atelectasis or pneumonia. There is a small left pneumothorax. No fractures demonstrated in the visualized ribs.  1 cm low-attenuation lesion in the lateral segment left lobe of the liver likely representing a cyst. The unenhanced appearance of the gallbladder, spleen, pancreas, adrenal glands, inferior  vena cava, and retroperitoneal lymph nodes is unremarkable. Parapelvic cysts in the kidneys. No evidence of hydronephrosis. Bilateral renal atrophy. The stomach is markedly distended with an air-fluid level. Diffuse distention of multiple small bowel loops with air-fluid levels. Colon is decompressed. Distal small bowel are decompressed. Appearance is consistent with small bowel obstruction at the level of the right lower quadrant distal ileum. No free air or free fluid in the abdomen.  Pelvis: Diffuse bladder wall thickening suggesting cystitis. Prostate gland is enlarged with calcification. Small right inguinal hernia with fluid. No bowel herniation. No free or loculated pelvic fluid collections. Degenerative changes throughout the spine. No destructive bone lesions. Mild scoliosis convex towards the left.  IMPRESSION: Small left hydro pneumothorax with consolidation or atelectasis in the left lung base.  Diffuse distention of stomach and small bowel with transition zone to decompressed terminal  ileum. This is consistent with small bowel obstruction.  Small right inguinal hernia without bowel herniation. Prostate enlargement.  These results were called by telephone at the time of interpretation on 05/24/2015 at 5:11 am to Dr. Preston Fleeting and Dr. Clyde Lundborg, who verbally acknowledged these results.   Electronically Signed   By: Burman Nieves M.D.   On: 05/24/2015 05:20   Dg Chest Port 1 View  05/24/2015   CLINICAL DATA:  Sudden onset and worsening diarrhea since this evening. Weakness and abdominal pain. Complained of abdominal pain shortly after eating [place] food.  EXAM: PORTABLE CHEST - 1 VIEW  COMPARISON:  None.  FINDINGS: Shallow inspiration. Normal heart size and pulmonary vascularity for technique. Diffuse interstitial pattern to the lungs probably representing chronic fibrosis. Acute interstitial pneumonitis or edema not entirely excluded. No focal consolidation. Blunting of the left costophrenic angle suggesting a small pleural effusion. No pneumothorax. Calcification of the aorta.  IMPRESSION: Small left pleural effusion. Diffuse interstitial pattern to the lungs probably fibrosis but can't exclude acute interstitial process. No focal consolidation.   Electronically Signed   By: Burman Nieves M.D.   On: 05/24/2015 04:10   Dg Abd 2 Views  05/25/2015   CLINICAL DATA:  81 year old male with abdominal pain and small-bowel obstruction. Follow-up radiograph.  EXAM: ABDOMEN - 2 VIEW  COMPARISON:  CT dated 05/24/2015  FINDINGS: Multiple mildly dilated air-filled loops of small bowel noted in the upper abdomen. Oral contrast from the prior CT is now seen within thecolon. No free air or radiopaque calculi identified. A catheter is noted in the bladder. There is degenerative changes of the spine.  IMPRESSION: Mildly dilated air-filled loops of small bowel in the upper abdomen with interval improvement of the bowel dilatation compared to the prior study. Oral contrast from prior CT is now seen within the colon.  Follow-up recommended.   Electronically Signed   By: Elgie Collard M.D.   On: 05/25/2015 20:48    Microbiology: Recent Results (from the past 240 hour(s))  Urine culture     Status: None   Collection Time: 05/24/15 12:40 AM  Result Value Ref Range Status   Specimen Description URINE, RANDOM  Final   Special Requests NONE  Final   Culture   Final    50,000 COLONIES/mL PROTEUS MIRABILIS Performed at [facility]    Report Status 05/26/2015 FINAL  Final   Organism ID, Bacteria PROTEUS MIRABILIS  Final      Susceptibility   Proteus mirabilis - MIC*    AMPICILLIN 4 SENSITIVE Sensitive     CEFAZOLIN 8 SENSITIVE Sensitive     CEFTRIAXONE <=1 SENSITIVE  Sensitive     CIPROFLOXACIN <=0.25 SENSITIVE Sensitive     GENTAMICIN <=1 SENSITIVE Sensitive     IMIPENEM >=16 RESISTANT Resistant     NITROFURANTOIN 64 RESISTANT Resistant     TRIMETH/SULFA <=20 SENSITIVE Sensitive     AMPICILLIN/SULBACTAM 4 SENSITIVE Sensitive     PIP/TAZO <=4 SENSITIVE Sensitive     * 50,000 COLONIES/mL PROTEUS MIRABILIS  Culture, blood (routine x 2)     Status: None (Preliminary result)   Collection Time: 05/24/15  2:00 AM  Result Value Ref Range Status   Specimen Description BLOOD LEFT AC  Final   Special Requests BOTTLES DRAWN AEROBIC AND ANAEROBIC 5CC  Final   Culture   Final    NO GROWTH 4 DAYS Performed at [facility]    Report Status PENDING  Incomplete  Culture, blood (routine x 2)     Status: None (Preliminary result)   Collection Time: 05/24/15  2:00 AM  Result Value Ref Range Status   Specimen Description BLOOD FOREARM  Final   Special Requests BOTTLES DRAWN AEROBIC AND ANAEROBIC 5CC  Final   Culture   Final    NO GROWTH 4 DAYS Performed at [facility]    Report Status PENDING  Incomplete     Labs: Basic Metabolic Panel:  Recent Labs Lab 05/24/15 0116 05/25/15 0902 05/26/15 0410 05/27/15 0407  NA 142 142 144 138  K 4.1 4.2 4.0 3.7  CL 107 118* 119* 114*   CO2 21* 19* 19* 19*  GLUCOSE 245* 102* 77 119*  BUN 66* 53* 41* 29*  CREATININE 2.51* 1.87* 1.56* 1.21  CALCIUM 8.9 7.9* 8.0* 7.9*   Liver Function Tests:  Recent Labs Lab 05/24/15 0116 05/25/15 0902 05/26/15 0410 05/27/15 0407  AST 34 17 15 18   ALT 21 15* 15* 14*  ALKPHOS 91 62 60 63  BILITOT 0.7 0.5 0.5 0.4  PROT 6.6 5.0* 4.9* 5.1*  ALBUMIN 3.1* 2.3* 2.3* 2.3*   No results for input(s): LIPASE, AMYLASE in the last 168 hours. No results for input(s): AMMONIA in the last 168 hours. CBC:  Recent Labs Lab 05/24/15 0116 05/25/15 0902 05/26/15 0410 05/27/15 0407  WBC 25.6* 9.1 5.9 4.9  NEUTROABS 23.3*  --   --   --   HGB 7.6* 10.8* 11.2* 11.6*  HCT 23.0* 32.8* 34.0* 35.3*  MCV 90.6 90.4 90.7 90.3  PLT 292 176 190 205   Cardiac Enzymes:  Recent Labs Lab 05/24/15 0116  TROPONINI 0.03   BNP: BNP (last 3 results) No results for input(s): BNP in the last 8760 hours.  ProBNP (last 3 results) No results for input(s): PROBNP in the last 8760 hours.  CBG:  Recent Labs Lab 05/27/15 0754 05/27/15 1153 05/27/15 1643 05/27/15 2149 05/28/15 0752  GLUCAP 108* 228* 165* 125* 109*       Signed:  Munir Victorian  [facility] 05/28/2015, 3:42 PM

## 2015-05-28 NOTE — Progress Notes (Signed)
Pt's Foley discontinued at 1030.  Pt due to void. Bladder scan results yield 30ccs. MD made aware. MD states ok to discharge patient.

## 2015-05-29 LAB — CULTURE, BLOOD (ROUTINE X 2)
Culture: NO GROWTH
Culture: NO GROWTH

## 2015-12-12 ENCOUNTER — Encounter (HOSPITAL_COMMUNITY): Payer: Self-pay | Admitting: Emergency Medicine

## 2015-12-12 ENCOUNTER — Emergency Department (INDEPENDENT_AMBULATORY_CARE_PROVIDER_SITE_OTHER)
Admission: EM | Admit: 2015-12-12 | Discharge: 2015-12-12 | Disposition: A | Discharge status: Home / Self Care | Payer: Medicare HMO | Source: Home / Self Care | Attending: Emergency Medicine | Admitting: Emergency Medicine

## 2015-12-12 DIAGNOSIS — B029 Zoster without complications: Secondary | ICD-10-CM

## 2015-12-12 MED ORDER — ACYCLOVIR 800 MG PO TABS: ORAL_TABLET | ORAL | Status: DC

## 2015-12-12 NOTE — Discharge Instructions (Signed)

## 2015-12-12 NOTE — ED Provider Notes (Signed)
CSN: [id number]     Arrival date & time 12/12/15  1554 History   First MD Initiated Contact with Patient 12/12/15 1645     Chief Complaint  Patient presents with  . Rash   (Consider location/radiation/quality/duration/timing/severity/associated sxs/prior Treatment) Patient is a 82 y.o. male presenting with rash. The history is provided by the patient. No language interpreter was used.  Rash Location:  Leg and ano-genital Leg rash location:  L leg Quality: painful and redness   Pain details:    Quality:  Aching   Severity:  Moderate   Onset quality:  Gradual   Duration:  1 day   Timing:  Constant   Progression:  Worsening Severity:  Moderate Timing:  Constant Progression:  Worsening Chronicity:  New Relieved by:  Nothing Worsened by:  Nothing tried Ineffective treatments:  None tried Pt's granddaughter is concerned that pt has shingles.  Pt has a rash on his bottom and back of left leg.  She noticed rash yesterday  Past Medical History  Diagnosis Date  . Diabetes mellitus without complication (HCC)   . Glaucoma    History reviewed. No pertinent past surgical history. No family history on file. Social History  Substance Use Topics  . Smoking status: Former Games developermoker  . Smokeless tobacco: Never Used  . Alcohol Use: No    Review of Systems  Skin: Positive for rash.  All other systems reviewed and are negative.   Allergies  Review of patient's allergies indicates no known allergies.  Home Medications   Prior to Admission medications   Medication Sig Start Date End Date Taking? Authorizing Provider  acyclovir (ZOVIRAX) 800 MG tablet One po qid 12/12/15   Elson AreasLeslie K Sofia, PA-C  ciprofloxacin (CIPRO) 500 MG tablet Take 1 tablet (500 mg total) by mouth 2 (two) times daily. Patient not taking: Reported on 12/12/2015 05/28/15   Jeralyn BennettEzequiel Zamora, MD  DM-Phenylephrine-Acetaminophen (TYLENOL COLD HEAD CONGESTION) 10-5-325 MG TABS Take 2 tablets by mouth once.    Historical Provider,  MD   Meds Ordered and Administered this Visit  Medications - No data to display  BP 143/89 mmHg  Pulse 91  Temp(Src) 97.8 F (36.6 C) (Oral)  SpO2 95% No data found.   Physical Exam  Constitutional: He is oriented to person, place, and time. He appears well-developed and well-nourished.  HENT:  Head: Normocephalic.  Eyes: Conjunctivae and EOM are normal. Pupils are equal, round, and reactive to light.  Neck: Normal range of motion.  Cardiovascular: Normal rate.   Pulmonary/Chest: Effort normal.  Abdominal: Soft. He exhibits no distension.  Musculoskeletal: Normal range of motion.  Neurological: He is alert and oriented to person, place, and time.  Skin:  Red raised rash left upper posterior leg, redness left side of bottom.   Psychiatric: He has a normal mood and affect.  Nursing note and vitals reviewed.   ED Course  Procedures (including critical care time)  Labs Review Labs Reviewed - No data to display  Imaging Review No results found.   Visual Acuity Review  Right Eye Distance:   Left Eye Distance:   Bilateral Distance:    Right Eye Near:   Left Eye Near:    Bilateral Near:         MDM  Pt may have early shingles,  (rash could be from irritation from incontience)  I will start acyclovir given pt's age and possible shingles.   Pt needs to recheck with his MD in 2-3 days.   I counseled  granddaughter on skin care,     1. Shingles    Meds ordered this encounter  Medications  . DISCONTD: acyclovir (ZOVIRAX) 800 MG tablet    Sig: One po qid    Dispense:  40 tablet    Refill:  0    Order Specific Question:  Supervising Provider    Answer:  Charm Rings [id number]  . DISCONTD: acyclovir (ZOVIRAX) 800 MG tablet    Sig: One po qid    Dispense:  40 tablet    Refill:  0    Order Specific Question:  Supervising Provider    Answer:  Charm Rings [id number]  . acyclovir (ZOVIRAX) 800 MG tablet    Sig: One po qid    Dispense:  40 tablet    Refill:  0     Order Specific Question:  Supervising Provider    Answer:  Micheline Chapman      Elson Areas, PA-C 12/12/15 1737

## 2015-12-12 NOTE — ED Notes (Signed)
Left leg and left buttocks with rash.  Caregiver noticed this today.  Patient is incontinent

## 2015-12-21 ENCOUNTER — Emergency Department (HOSPITAL_COMMUNITY): Payer: Medicare HMO

## 2015-12-21 ENCOUNTER — Emergency Department (HOSPITAL_COMMUNITY)
Admission: EM | Admit: 2015-12-21 | Discharge: 2015-12-21 | Disposition: A | Discharge status: Home / Self Care | Payer: Medicare HMO | Attending: Emergency Medicine | Admitting: Emergency Medicine

## 2015-12-21 DIAGNOSIS — Z79899 Other long term (current) drug therapy: Secondary | ICD-10-CM | POA: Diagnosis not present

## 2015-12-21 DIAGNOSIS — Z8669 Personal history of other diseases of the nervous system and sense organs: Secondary | ICD-10-CM | POA: Diagnosis not present

## 2015-12-21 DIAGNOSIS — E119 Type 2 diabetes mellitus without complications: Secondary | ICD-10-CM | POA: Diagnosis not present

## 2015-12-21 DIAGNOSIS — R0602 Shortness of breath: Secondary | ICD-10-CM | POA: Insufficient documentation

## 2015-12-21 DIAGNOSIS — R079 Chest pain, unspecified: Secondary | ICD-10-CM | POA: Insufficient documentation

## 2015-12-21 DIAGNOSIS — Z87891 Personal history of nicotine dependence: Secondary | ICD-10-CM | POA: Diagnosis not present

## 2015-12-21 LAB — BASIC METABOLIC PANEL
ANION GAP: 9 (ref 5–15)
BUN: 39 mg/dL — AB (ref 6–20)
CO2: 21 mmol/L — ABNORMAL LOW (ref 22–32)
Calcium: 8.8 mg/dL — ABNORMAL LOW (ref 8.9–10.3)
Chloride: 112 mmol/L — ABNORMAL HIGH (ref 101–111)
Creatinine, Ser: 1.7 mg/dL — ABNORMAL HIGH (ref 0.61–1.24)
GFR, EST AFRICAN AMERICAN: 39 mL/min — AB (ref >60)
GFR, EST NON AFRICAN AMERICAN: 33 mL/min — AB (ref >60)
Glucose, Bld: 103 mg/dL — ABNORMAL HIGH (ref 65–99)
POTASSIUM: 4.5 mmol/L (ref 3.5–5.1)
SODIUM: 142 mmol/L (ref 135–145)

## 2015-12-21 LAB — CBC
HEMATOCRIT: 35.5 % — AB (ref 39.0–52.0)
Hemoglobin: 11.7 g/dL — ABNORMAL LOW (ref 13.0–17.0)
MCH: 30.1 pg (ref 26.0–34.0)
MCHC: 33 g/dL (ref 30.0–36.0)
MCV: 91.3 fL (ref 78.0–100.0)
Platelets: 220 10*3/uL (ref 150–400)
RBC: 3.89 MIL/uL — AB (ref 4.22–5.81)
RDW: 15.6 % — AB (ref 11.5–15.5)
WBC: 7.5 10*3/uL (ref 4.0–10.5)

## 2015-12-21 LAB — I-STAT TROPONIN, ED
TROPONIN I, POC: 0.02 ng/mL (ref 0.00–0.08)
Troponin i, poc: 0.02 ng/mL (ref 0.00–0.08)

## 2015-12-21 NOTE — ED Notes (Signed)
Pts granddcaughter requesting to speak with the charge rn regarding encounter with Inetta Fermoina, NT, Charge RN aware

## 2015-12-21 NOTE — ED Provider Notes (Signed)
CSN: [id number]     Arrival date & time 12/21/15  1450 History   First MD Initiated Contact with Patient 12/21/15 1501     Chief Complaint  Patient presents with  . Chest Pain     (Consider location/radiation/quality/duration/timing/severity/associated sxs/prior Treatment) Patient is a 82 y.o. male presenting with chest pain. The history is provided by the patient.  Chest Pain Pain location:  L chest Pain quality: aching and pressure   Pain radiates to:  Does not radiate Pain radiates to the back: no   Pain severity:  Mild Onset quality:  Gradual Duration:  1 hour Timing:  Constant Progression:  Resolved Chronicity:  New Relieved by:  Nothing Worsened by:  Nothing tried Ineffective treatments:  None tried Associated symptoms: shortness of breath   Associated symptoms: no abdominal pain, no fever, no headache, no palpitations and not vomiting    82 yo M with a chief complaint of chest pain. Little 5 caveat dementia. Patient states had less than an hour of chest pain a month ago.  Denies recurrent episode denies current chest pain. Unsure what made better or worse, since was so long ago.   Past Medical History  Diagnosis Date  . Diabetes mellitus without complication (HCC)   . Glaucoma    No past surgical history on file. No family history on file. Social History  Substance Use Topics  . Smoking status: Former Games developer  . Smokeless tobacco: Never Used  . Alcohol Use: No    Review of Systems  Constitutional: Negative for fever and chills.  HENT: Negative for congestion and facial swelling.   Eyes: Negative for discharge and visual disturbance.  Respiratory: Positive for chest tightness and shortness of breath.   Cardiovascular: Negative for chest pain and palpitations.  Gastrointestinal: Negative for vomiting, abdominal pain and diarrhea.  Musculoskeletal: Negative for myalgias and arthralgias.  Skin: Negative for color change and rash.  Neurological: Negative for  tremors, syncope and headaches.  Psychiatric/Behavioral: Negative for confusion and dysphoric mood.      Allergies  Review of patient's allergies indicates no known allergies.  Home Medications   Prior to Admission medications   Medication Sig Start Date End Date Taking? Authorizing Provider  acetaminophen (TYLENOL) 500 MG tablet Take 1,000 mg by mouth every 6 (six) hours as needed (pain).   Yes Historical Provider, MD  acyclovir (ZOVIRAX) 800 MG tablet One po qid Patient taking differently: Take 800 mg by mouth 4 (four) times daily. 10 day course filled 12/12/15 12/12/15  Yes Lonia Skinner Sofia, PA-C  docusate sodium (COLACE) 100 MG capsule Take 100 mg by mouth daily as needed for mild constipation.   Yes Historical Provider, MD  Multiple Vitamin (MULTIVITAMIN WITH MINERALS) TABS tablet Take 1 tablet by mouth daily.   Yes Historical Provider, MD   BP 130/77 mmHg  Pulse 87  Temp(Src) 97.5 F (36.4 C) (Oral)  Resp 16  SpO2 99% Physical Exam  Constitutional: He is oriented to person, place, and time. He appears well-developed and well-nourished.  Disheveled.  Food on him  HENT:  Head: Normocephalic and atraumatic.  Eyes: EOM are normal. Pupils are equal, round, and reactive to light.  Neck: Normal range of motion. Neck supple. No JVD present.  Cardiovascular: Normal rate and regular rhythm.  Exam reveals no gallop and no friction rub.   No murmur heard. Pulmonary/Chest: No respiratory distress. He has no wheezes.  Abdominal: He exhibits no distension. There is no tenderness. There is no rebound and no  guarding.  Musculoskeletal: Normal range of motion.  Neurological: He is alert and oriented to person, place, and time.  Skin: No rash noted. No pallor.  Psychiatric: He has a normal mood and affect. His behavior is normal.  Nursing note and vitals reviewed.   ED Course  Procedures (including critical care time) Labs Review Labs Reviewed  BASIC METABOLIC PANEL - Abnormal; Notable  for the following:    Chloride 112 (*)    CO2 21 (*)    Glucose, Bld 103 (*)    BUN 39 (*)    Creatinine, Ser 1.70 (*)    Calcium 8.8 (*)    GFR calc non Af Amer 33 (*)    GFR calc Af Amer 39 (*)    All other components within normal limits  CBC - Abnormal; Notable for the following:    RBC 3.89 (*)    Hemoglobin 11.7 (*)    HCT 35.5 (*)    RDW 15.6 (*)    All other components within normal limits  I-STAT TROPOININ, ED  Rosezena SensorI-STAT TROPOININ, ED    Imaging Review Dg Chest 2 View  12/21/2015  CLINICAL DATA:  Chest pain EXAM: CHEST  2 VIEW COMPARISON:  05/24/2015 FINDINGS: Heart size upper normal. Negative for heart failure. Aortic calcification. Mild left lower lobe airspace disease similar to the prior study. This may represent atelectasis or infiltrate. Small left effusion. IMPRESSION: Mild left lower lobe atelectasis/infiltrate, similar to 05/24/2015 Electronically Signed   By: Marlan Palauharles  Clark M.D.   On: 12/21/2015 15:43   I have personally reviewed and evaluated these images and lab results as part of my medical decision-making.   EKG Interpretation   Date/Time:  Monday December 21 2015 15:05:37 EDT Ventricular Rate:  86 PR Interval:  165 QRS Duration: 135 QT Interval:  398 QTC Calculation: 476 R Axis:   59 Text Interpretation:  Sinus rhythm Multiple ventricular premature  complexes Right bundle branch block No significant change since last  tracing Confirmed by Deya Bigos MD, Reuel BoomANIEL [id number]) on 12/21/2015 3:10:12 PM      MDM   Final diagnoses:  Chest pain, unspecified chest pain type    82 yo M With a chief complaint chest pain. Not currently having chest pain. Per his report this happened about a month ago. Unable to obtain further history. Will obtain a delta troponin.  Delta negative, d/c home.    I have discussed the diagnosis/risks/treatment options with the patient and family and believe the pt to be eligible for discharge home to follow-up with PCP. We also discussed  returning to the ED immediately if new or worsening sx occur. We discussed the sx which are most concerning (e.g., sudden worsening pain, fever, inability to tolerate by mouth) that necessitate immediate return. Medications administered to the patient during their visit and any new prescriptions provided to the patient are listed below.  Medications given during this visit Medications - No data to display  Discharge Medication List as of 12/21/2015  6:48 PM      The patient appears reasonably screen and/or stabilized for discharge and I doubt any other medical condition or other [facility] requiring further screening, evaluation, or treatment in the ED at this time prior to discharge.    Melene Planan Sutton Hirsch, DO 12/21/15 2349

## 2015-12-21 NOTE — Discharge Instructions (Signed)
Nonspecific Chest Pain  °Chest pain can be caused by many different conditions. There is always a chance that your pain could be related to something serious, such as a heart attack or a blood clot in your lungs. Chest pain can also be caused by conditions that are not life-threatening. If you have chest pain, it is very important to follow up with your health care provider. °CAUSES  °Chest pain can be caused by: °· Heartburn. °· Pneumonia or bronchitis. °· Anxiety or stress. °· Inflammation around your heart (pericarditis) or lung (pleuritis or pleurisy). °· A blood clot in your lung. °· A collapsed lung (pneumothorax). It can develop suddenly on its own (spontaneous pneumothorax) or from trauma to the chest. °· Shingles infection (varicella-zoster virus). °· Heart attack. °· Damage to the bones, muscles, and cartilage that make up your chest wall. This can include: °¨ Bruised bones due to injury. °¨ Strained muscles or cartilage due to frequent or repeated coughing or overwork. °¨ Fracture to one or more ribs. °¨ Sore cartilage due to inflammation (costochondritis). °RISK FACTORS  °Risk factors for chest pain may include: °· Activities that increase your risk for trauma or injury to your chest. °· Respiratory infections or conditions that cause frequent coughing. °· Medical conditions or overeating that can cause heartburn. °· Heart disease or family history of heart disease. °· Conditions or health behaviors that increase your risk of developing a blood clot. °· Having had chicken pox (varicella zoster). °SIGNS AND SYMPTOMS °Chest pain can feel like: °· Burning or tingling on the surface of your chest or deep in your chest. °· Crushing, pressure, aching, or squeezing pain. °· Dull or sharp pain that is worse when you move, cough, or take a deep breath. °· Pain that is also felt in your back, neck, shoulder, or arm, or pain that spreads to any of these areas. °Your chest pain may come and go, or it may stay  constant. °DIAGNOSIS °Lab tests or other studies may be needed to find the cause of your pain. Your health care provider may have you take a test called an ambulatory ECG (electrocardiogram). An ECG records your heartbeat patterns at the time the test is performed. You may also have other tests, such as: °· Transthoracic echocardiogram (TTE). During echocardiography, sound waves are used to create a picture of all of the heart structures and to look at how blood flows through your heart. °· Transesophageal echocardiogram (TEE). This is a more advanced imaging test that obtains images from inside your body. It allows your health care provider to see your heart in finer detail. °· Cardiac monitoring. This allows your health care provider to monitor your heart rate and rhythm in real time. °· Holter monitor. This is a portable device that records your heartbeat and can help to diagnose abnormal heartbeats. It allows your health care provider to track your heart activity for several days, if needed. °· Stress tests. These can be done through exercise or by taking medicine that makes your heart beat more quickly. °· Blood tests. °· Imaging tests. °TREATMENT  °Your treatment depends on what is causing your chest pain. Treatment may include: °· Medicines. These may include: °¨ Acid blockers for heartburn. °¨ Anti-inflammatory medicine. °¨ Pain medicine for inflammatory conditions. °¨ Antibiotic medicine, if an infection is present. °¨ Medicines to dissolve blood clots. °¨ Medicines to treat coronary artery disease. °· Supportive care for conditions that do not require medicines. This may include: °¨ Resting. °¨ Applying heat   or cold packs to injured areas. °¨ Limiting activities until pain decreases. °HOME CARE INSTRUCTIONS °· If you were prescribed an antibiotic medicine, finish it all even if you start to feel better. °· Avoid any activities that bring on chest pain. °· Do not use any tobacco products, including  cigarettes, chewing tobacco, or electronic cigarettes. If you need help quitting, ask your health care provider. °· Do not drink alcohol. °· Take medicines only as directed by your health care provider. °· Keep all follow-up visits as directed by your health care provider. This is important. This includes any further testing if your chest pain does not go away. °· If heartburn is the cause for your chest pain, you may be told to keep your head raised (elevated) while sleeping. This reduces the chance that acid will go from your stomach into your esophagus. °· Make lifestyle changes as directed by your health care provider. These may include: °¨ Getting regular exercise. Ask your health care provider to suggest some activities that are safe for you. °¨ Eating a heart-healthy diet. A registered dietitian can help you to learn healthy eating options. °¨ Maintaining a healthy weight. °¨ Managing diabetes, if necessary. °¨ Reducing stress. °SEEK MEDICAL CARE IF: °· Your chest pain does not go away after treatment. °· You have a rash with blisters on your chest. °· You have a fever. °SEEK IMMEDIATE MEDICAL CARE IF:  °· Your chest pain is worse. °· You have an increasing cough, or you cough up blood. °· You have severe abdominal pain. °· You have severe weakness. °· You faint. °· You have chills. °· You have sudden, unexplained chest discomfort. °· You have sudden, unexplained discomfort in your arms, back, neck, or jaw. °· You have shortness of breath at any time. °· You suddenly start to sweat, or your skin gets clammy. °· You feel nauseous or you vomit. °· You suddenly feel light-headed or dizzy. °· Your heart begins to beat quickly, or it feels like it is skipping beats. °These symptoms may represent a serious problem that is an emergency. Do not wait to see if the symptoms will go away. Get medical help right away. Call your local emergency services (911 in the [place] Do not drive yourself to the hospital. °  °This  information is not intended to replace advice given to you by your health care provider. Make sure you discuss any questions you have with your health care provider. °  °Document Released: 06/08/2005 Document Revised: 09/19/2014 Document Reviewed: 04/04/2014 °Elsevier Interactive Patient Education ©2016 [employer] ° °

## 2015-12-21 NOTE — ED Notes (Signed)
Pt was at home and started having CP "feeling of something sitting on chest." Lasted approx 10 min. Pt now denies CP. Pt received 324mg  ASA. EMS reports RBBB on EKG HR 70's. Pt has hx of dementia. BP 180/90. CBG 122

## 2015-12-21 NOTE — ED Notes (Signed)
Patient care taker came out talk to the sec stated that the patient needed to be changed,i then went into the room to help has I was helping the patient the care taker stated that he had shingles, then I said I was going to get a mask outside the room, so I went to the nurses desk to get mask, and I said to the nurse did she tell yall that he had shingles, they quoted no, so I went back in to change the patient the care taker got in my faces screaming,so I walked out the sec called secuity

## 2015-12-21 NOTE — ED Notes (Signed)
Patient's daughter came out of room, telling myself and Inetta Fermoina, [place], that her father had peed on himself and needed to be cleaned up. Daughter stated she could do it herself, but just needed help. Inetta Fermoina, NT got up and went to go help daughter clean patient up. Shortly thereafter, Inetta Fermoina, NT came out of room to the nurses desk, and said that patient had shingles. Inetta Fermoina, NT grabbed a mask and precaution droplet signs to put on the outside of sliding door, asking if it would be contact or airborne. Once [place], NT went back into room and started to clean patient up, I heard yelling from patient's daughter, something about that she had let everyone know that patient had shingles. Called for security to come to the patients room twice. Inetta Fermoina, NT told patient's daughter that she can not speak to her like that and walked out of room. Inetta Fermoina, NT upset and stated that patients daughter got in her face like she was going to punch her. Inetta Fermoina, NT stated that daughter of patient said she came to nurses desk and told us, that patient had shingles. Daughter of patient did not do this. Daughter of patient came to me 1st time and asked for the ER Dr to walk in, mentioned that I would have him come in when I saw him. 2nd time the patients daughter came to desk, was to tell myself and Inetta Fermoina, NT that patient had peed on himself.

## 2015-12-29 ENCOUNTER — Encounter: Payer: Self-pay | Admitting: Internal Medicine

## 2015-12-29 ENCOUNTER — Ambulatory Visit (INDEPENDENT_AMBULATORY_CARE_PROVIDER_SITE_OTHER): Payer: Medicare HMO | Admitting: Internal Medicine

## 2015-12-29 VITALS — Temp 97.9°F | Ht 73.0 in

## 2015-12-29 DIAGNOSIS — E1122 Type 2 diabetes mellitus with diabetic chronic kidney disease: Secondary | ICD-10-CM | POA: Diagnosis not present

## 2015-12-29 DIAGNOSIS — N183 Chronic kidney disease, stage 3 (moderate): Principal | ICD-10-CM

## 2015-12-29 DIAGNOSIS — N189 Chronic kidney disease, unspecified: Secondary | ICD-10-CM

## 2015-12-29 DIAGNOSIS — H903 Sensorineural hearing loss, bilateral: Secondary | ICD-10-CM | POA: Diagnosis not present

## 2015-12-29 DIAGNOSIS — L84 Corns and callosities: Secondary | ICD-10-CM

## 2015-12-29 DIAGNOSIS — E119 Type 2 diabetes mellitus without complications: Secondary | ICD-10-CM

## 2015-12-29 DIAGNOSIS — K08109 Complete loss of teeth, unspecified cause, unspecified class: Secondary | ICD-10-CM

## 2015-12-29 DIAGNOSIS — R3981 Functional urinary incontinence: Secondary | ICD-10-CM

## 2015-12-29 DIAGNOSIS — K000 Anodontia: Secondary | ICD-10-CM

## 2015-12-29 LAB — POCT GLYCOSYLATED HEMOGLOBIN (HGB A1C): HEMOGLOBIN A1C: 6.7

## 2015-12-29 LAB — GLUCOSE, CAPILLARY: Glucose-Capillary: 136 mg/dL — ABNORMAL HIGH (ref 65–99)

## 2015-12-29 NOTE — Progress Notes (Signed)
   Patient ID: Francisco Roberts male   DOB: 1922/07/27 82 y.o.   MRN: [id number]  Subjective:   HPI: [name] is a 82 y.o. with PMH of T2DM, CKD, glaucoma, and bilateral hearing loss who [facility] to establish care in our clinic as a new patient.   Please see problem-based charting for status of medical issues pertinent to this visit.  Review of Systems: Pertinent items noted in HPI and remainder of comprehensive ROS otherwise negative.  Objective:  Physical Exam: Filed Vitals:   12/29/15 0922  Temp: 97.9 F (36.6 C)  TempSrc: Oral  Height: 6\' 1"  (1.854 m)  SpO2: 100%   Gen: Well-appearing, alert and oriented to person, place, and time HEENT: Oropharynx clear without erythema or exudate. TM's clear without significant impacted cerumen. Bilateral hearing loss. Neck: No cervical LAD, no thyromegaly or nodules, no JVD noted. CV: Normal rate, regular rhythm, no murmurs, rubs, or gallops Pulmonary: Normal effort, CTA bilaterally, no wheezing, rales, or rhonchi Abdominal: Soft, non-tender, non-distended, without rebound, guarding, or masses Extremities: Distal pulses 2+ in upper and lower extremities bilaterally, no tenderness, erythema or edema Skin: No atypical appearing moles. No rashes  Assessment & Plan:  Please see problem-based charting for assessment and plan.  Reubin MilanBilly Somara Frymire, MD Resident Physician, PGY-1 Department of Internal Medicine [facility]

## 2015-12-30 DIAGNOSIS — K08109 Complete loss of teeth, unspecified cause, unspecified class: Secondary | ICD-10-CM | POA: Insufficient documentation

## 2015-12-30 DIAGNOSIS — L84 Corns and callosities: Secondary | ICD-10-CM | POA: Insufficient documentation

## 2015-12-30 DIAGNOSIS — K000 Anodontia: Secondary | ICD-10-CM

## 2015-12-30 DIAGNOSIS — H903 Sensorineural hearing loss, bilateral: Secondary | ICD-10-CM | POA: Insufficient documentation

## 2015-12-30 DIAGNOSIS — R32 Unspecified urinary incontinence: Secondary | ICD-10-CM | POA: Insufficient documentation

## 2015-12-30 NOTE — Assessment & Plan Note (Signed)
Lab Results  Component Value Date   HGBA1C 6.7 12/29/2015     Assessment: Diabetes control:  excellent, not on medication Progress toward A1C goal:   at goal Comments: not on medication  Plan: Medications:  None Other plans: Instructed granddaughter that she could stop checking CBGs so frequently. If she wants to check every few days or if he is having symptoms, that is understandable but we agreed that sticking his finger less frequently would be a more comfortable approach as he is already very well controlled without medication (previously on metformin)

## 2015-12-30 NOTE — Assessment & Plan Note (Signed)
Patient wears depends from functional incontinence, and his granddaughter is the primary caretaker responsible for helping the patient with his ADLs, including changing his diapers (she also cares for his wife, who has much greater daily care needs). She does get assistance during the week but is interested in any further assistance available for he and his wife. -[facility]

## 2015-12-30 NOTE — Assessment & Plan Note (Signed)
Granddaughter states that the patient has bilateral calluses on his heels that make it painful for him to ambulate. Exam confirms this, without identifiable ulcers or other open wounds. Patient has excellently-controlled diabetes. Intact sensation and good peripheral pulses. -Referral to podiatry for callus shaving/toenail clipping

## 2015-12-30 NOTE — Assessment & Plan Note (Signed)
Patient with significant bilateral SNHL, likely presbycusis related to age. The granddaughter and patient would both like to see if he would benefit from hearing aids. I believe that this may help with his cognition as there is a body of literature showing the adverse cognitive effects of hearing loss in the elderly. -Recommended hearing evaluation at least at placed like [employer], [place]. However, these often just sell hearing aids without much programming assistance  -Also placed referral for formal audiologic evaluation -Follow-up at next visit

## 2015-12-30 NOTE — Assessment & Plan Note (Signed)
Patient has excellent appetite, but granddaughter has started to have to mash up food for him to eat enough, because he starts having indigestion/chest pain (which prompted an ED visit recently) directly from this, although OTC H2 blocker does relieve this. She is interested in fitting him for dentures so he is able to chew his food better himself without her having to puree his foods due to his edentulate state. -Provided patient with contact information for various providers in the area

## 2015-12-31 ENCOUNTER — Encounter: Payer: Self-pay | Admitting: Licensed Clinical Social Worker

## 2015-12-31 ENCOUNTER — Telehealth: Payer: Self-pay | Admitting: Licensed Clinical Social Worker

## 2015-12-31 NOTE — Telephone Encounter (Signed)
CSW placed called to pt to obtain agency of choice for home health services.  Pt was previously linked with [facility]; however, [facility] and [facility] are add'l in-network agencies.  CSW left message requesting return call. CSW provided contact hours and phone number.

## 2015-12-31 NOTE — Progress Notes (Signed)
Patient ID: Francisco Roberts, male   DOB: 18-Dec-1921, 82 y.o.   MRN: [id number] Mr. Mitchelle's completed [employer] application Part A and Part B faxed to [employer] eligibility office.  Fax confirmation received.

## 2016-01-01 NOTE — Progress Notes (Signed)
Case discussed with Dr. Kennedy at the time of the visit.  We reviewed the resident's history and exam and pertinent patient test results.  I agree with the assessment, diagnosis and plan of care documented in the resident's note. 

## 2016-01-01 NOTE — Telephone Encounter (Signed)
CSW placed call to Mr. Veto KempsRudd to [facility].  Initially, male answered the phone when this worker introduced self was hung up on.  CSW called back and left message requesting return call.

## 2016-01-04 NOTE — Telephone Encounter (Signed)
CSW placed call to Francisco Roberts's residence.  Grandson-in-law answered and provided pt's grand-daughter.  Grand-daughter states family has used [facility] in the past and would like [facility].  Referral faxed this afternoon.

## 2016-01-07 ENCOUNTER — Encounter: Payer: Self-pay | Admitting: *Deleted

## 2016-01-13 NOTE — Addendum Note (Signed)
Addended by: Darrick HuntsmanKENNEDY, Rona Tomson R on: 01/13/2016 07:38 AM   Modules accepted: Level of Service

## 2016-01-28 ENCOUNTER — Encounter: Payer: Self-pay | Admitting: *Deleted

## 2016-01-29 ENCOUNTER — Ambulatory Visit: Payer: Medicare HMO | Admitting: Internal Medicine

## 2016-01-29 ENCOUNTER — Emergency Department (HOSPITAL_COMMUNITY): Payer: Medicare HMO

## 2016-01-29 ENCOUNTER — Encounter (HOSPITAL_COMMUNITY): Payer: Self-pay | Admitting: Emergency Medicine

## 2016-01-29 ENCOUNTER — Inpatient Hospital Stay (HOSPITAL_COMMUNITY)
Admission: EM | Admit: 2016-01-29 | Discharge: 2016-02-01 | DRG: 377 | Disposition: A | Discharge status: Home Health | Payer: Medicare HMO | Attending: Internal Medicine | Admitting: Internal Medicine

## 2016-01-29 DIAGNOSIS — H409 Unspecified glaucoma: Secondary | ICD-10-CM | POA: Diagnosis present

## 2016-01-29 DIAGNOSIS — N179 Acute kidney failure, unspecified: Secondary | ICD-10-CM | POA: Diagnosis present

## 2016-01-29 DIAGNOSIS — I959 Hypotension, unspecified: Secondary | ICD-10-CM | POA: Diagnosis present

## 2016-01-29 DIAGNOSIS — R4182 Altered mental status, unspecified: Secondary | ICD-10-CM

## 2016-01-29 DIAGNOSIS — R4181 Age-related cognitive decline: Secondary | ICD-10-CM | POA: Diagnosis present

## 2016-01-29 DIAGNOSIS — J690 Pneumonitis due to inhalation of food and vomit: Secondary | ICD-10-CM | POA: Diagnosis present

## 2016-01-29 DIAGNOSIS — J984 Other disorders of lung: Secondary | ICD-10-CM | POA: Diagnosis not present

## 2016-01-29 DIAGNOSIS — L84 Corns and callosities: Secondary | ICD-10-CM | POA: Diagnosis present

## 2016-01-29 DIAGNOSIS — E1122 Type 2 diabetes mellitus with diabetic chronic kidney disease: Secondary | ICD-10-CM | POA: Diagnosis present

## 2016-01-29 DIAGNOSIS — N390 Urinary tract infection, site not specified: Secondary | ICD-10-CM | POA: Diagnosis present

## 2016-01-29 DIAGNOSIS — L8990 Pressure ulcer of unspecified site, unspecified stage: Secondary | ICD-10-CM | POA: Diagnosis present

## 2016-01-29 DIAGNOSIS — Z515 Encounter for palliative care: Secondary | ICD-10-CM | POA: Diagnosis present

## 2016-01-29 DIAGNOSIS — Z66 Do not resuscitate: Secondary | ICD-10-CM | POA: Diagnosis present

## 2016-01-29 DIAGNOSIS — K920 Hematemesis: Principal | ICD-10-CM | POA: Diagnosis present

## 2016-01-29 DIAGNOSIS — K921 Melena: Secondary | ICD-10-CM | POA: Diagnosis present

## 2016-01-29 DIAGNOSIS — N183 Chronic kidney disease, stage 3 (moderate): Secondary | ICD-10-CM | POA: Diagnosis present

## 2016-01-29 DIAGNOSIS — Z87891 Personal history of nicotine dependence: Secondary | ICD-10-CM

## 2016-01-29 DIAGNOSIS — R32 Unspecified urinary incontinence: Secondary | ICD-10-CM | POA: Diagnosis present

## 2016-01-29 DIAGNOSIS — R1110 Vomiting, unspecified: Secondary | ICD-10-CM

## 2016-01-29 DIAGNOSIS — B9689 Other specified bacterial agents as the cause of diseases classified elsewhere: Secondary | ICD-10-CM | POA: Diagnosis not present

## 2016-01-29 DIAGNOSIS — K922 Gastrointestinal hemorrhage, unspecified: Secondary | ICD-10-CM | POA: Diagnosis present

## 2016-01-29 DIAGNOSIS — D62 Acute posthemorrhagic anemia: Secondary | ICD-10-CM | POA: Diagnosis not present

## 2016-01-29 LAB — URINALYSIS, ROUTINE W REFLEX MICROSCOPIC
BILIRUBIN URINE: NEGATIVE
Glucose, UA: NEGATIVE mg/dL
KETONES UR: NEGATIVE mg/dL
NITRITE: NEGATIVE
PROTEIN: 30 mg/dL — AB
Specific Gravity, Urine: 1.021 (ref 1.005–1.030)
pH: 5.5 (ref 5.0–8.0)

## 2016-01-29 LAB — I-STAT TROPONIN, ED: Troponin i, poc: 0.02 ng/mL (ref 0.00–0.08)

## 2016-01-29 LAB — CBC WITH DIFFERENTIAL/PLATELET
Basophils Absolute: 0 10*3/uL (ref 0.0–0.1)
Basophils Relative: 0 %
Eosinophils Absolute: 0 10*3/uL (ref 0.0–0.7)
Eosinophils Relative: 0 %
HCT: 28.6 % — ABNORMAL LOW (ref 39.0–52.0)
HEMOGLOBIN: 9.5 g/dL — AB (ref 13.0–17.0)
LYMPHS ABS: 0.8 10*3/uL (ref 0.7–4.0)
Lymphocytes Relative: 6 %
MCH: 30.5 pg (ref 26.0–34.0)
MCHC: 33.2 g/dL (ref 30.0–36.0)
MCV: 92 fL (ref 78.0–100.0)
MONOS PCT: 6 %
Monocytes Absolute: 0.8 10*3/uL (ref 0.1–1.0)
NEUTROS ABS: 10.9 10*3/uL — AB (ref 1.7–7.7)
NEUTROS PCT: 88 %
Platelets: 188 10*3/uL (ref 150–400)
RBC: 3.11 MIL/uL — ABNORMAL LOW (ref 4.22–5.81)
RDW: 16 % — ABNORMAL HIGH (ref 11.5–15.5)
WBC: 12.5 10*3/uL — ABNORMAL HIGH (ref 4.0–10.5)

## 2016-01-29 LAB — TYPE AND SCREEN
ABO/RH(D): O POS
ANTIBODY SCREEN: NEGATIVE

## 2016-01-29 LAB — URINE MICROSCOPIC-ADD ON: Squamous Epithelial / LPF: NONE SEEN

## 2016-01-29 LAB — COMPREHENSIVE METABOLIC PANEL
ALK PHOS: 77 U/L (ref 38–126)
ALT: 16 U/L — AB (ref 17–63)
ANION GAP: 6 (ref 5–15)
AST: 19 U/L (ref 15–41)
Albumin: 2.8 g/dL — ABNORMAL LOW (ref 3.5–5.0)
BUN: 75 mg/dL — ABNORMAL HIGH (ref 6–20)
CALCIUM: 8 mg/dL — AB (ref 8.9–10.3)
CO2: 21 mmol/L — AB (ref 22–32)
CREATININE: 2.06 mg/dL — AB (ref 0.61–1.24)
Chloride: 116 mmol/L — ABNORMAL HIGH (ref 101–111)
GFR calc Af Amer: 31 mL/min — ABNORMAL LOW (ref >60)
GFR calc non Af Amer: 27 mL/min — ABNORMAL LOW (ref >60)
GLUCOSE: 206 mg/dL — AB (ref 65–99)
Potassium: 5.1 mmol/L (ref 3.5–5.1)
SODIUM: 143 mmol/L (ref 135–145)
Total Bilirubin: 0.2 mg/dL — ABNORMAL LOW (ref 0.3–1.2)
Total Protein: 5.2 g/dL — ABNORMAL LOW (ref 6.5–8.1)

## 2016-01-29 LAB — I-STAT CG4 LACTIC ACID, ED
Lactic Acid, Venous: 0.62 mmol/L (ref 0.5–2.0)
Lactic Acid, Venous: 1.39 mmol/L (ref 0.5–2.0)

## 2016-01-29 LAB — POC OCCULT BLOOD, ED: Fecal Occult Bld: POSITIVE — AB

## 2016-01-29 MED ORDER — SODIUM CHLORIDE 0.9 % IV BOLUS (SEPSIS)
1000.0000 mL | Freq: Once | INTRAVENOUS | Status: AC
  Administered 2016-01-29: 1000 mL via INTRAVENOUS

## 2016-01-29 MED ORDER — DEXTROSE 5 % IV SOLN
500.0000 mg | Freq: Once | INTRAVENOUS | Status: AC
  Administered 2016-01-29: 500 mg via INTRAVENOUS
  Filled 2016-01-29: qty 500

## 2016-01-29 MED ORDER — METOCLOPRAMIDE HCL 5 MG/ML IJ SOLN
10.0000 mg | Freq: Once | INTRAMUSCULAR | Status: AC
  Administered 2016-01-29: 10 mg via INTRAVENOUS
  Filled 2016-01-29: qty 2

## 2016-01-29 MED ORDER — DEXTROSE 5 % IV SOLN
1.0000 g | Freq: Once | INTRAVENOUS | Status: AC
  Administered 2016-01-29: 1 g via INTRAVENOUS
  Filled 2016-01-29: qty 10

## 2016-01-29 MED ORDER — SODIUM CHLORIDE 0.9 % IV SOLN
80.0000 mg | Freq: Once | INTRAVENOUS | Status: AC
  Administered 2016-01-29: 80 mg via INTRAVENOUS
  Filled 2016-01-29: qty 80

## 2016-01-29 MED ORDER — ONDANSETRON HCL 4 MG/2ML IJ SOLN
4.0000 mg | Freq: Once | INTRAMUSCULAR | Status: AC
  Administered 2016-01-29: 4 mg via INTRAVENOUS
  Filled 2016-01-29: qty 2

## 2016-01-29 NOTE — ED Notes (Signed)
[facility] called for transport. 

## 2016-01-29 NOTE — ED Provider Notes (Signed)
CSN: [id number]     Arrival date & time 01/29/16  1546 History   First MD Initiated Contact with Patient 01/29/16 1623     Chief Complaint  Patient presents with  . Altered Mental Status     (Consider location/radiation/quality/duration/timing/severity/associated sxs/prior Treatment) The history is provided by the patient and a relative.  Francisco Roberts is a 82 y.o. male hx of DM, glaucoma here with nausea, vomiting, AMS. Patient lives at home with his granddaughter. Patient had chili and lasagna yesterday and this morning woke up around 5 am and started vomiting. Granddaughter saw some bean in the vomit. He then had bowel movement but had no diarrhea. Denies abdominal pain. He was noted to be diffusely weak and confused today. He had an appointment with Internal Medicine teaching service in the clinic but granddaughter called EMS because he is so weak. EMS noticed that he is tachycardic, hypotensive 82/50, given 400 cc NS and BP on arrival was 134/70.     Past Medical History  Diagnosis Date  . Diabetes mellitus without complication (HCC)   . Glaucoma    History reviewed. No pertinent past surgical history. No family history on file. Social History  Substance Use Topics  . Smoking status: Former Games developer  . Smokeless tobacco: Former Neurosurgeon    Quit date: 12/28/1973  . Alcohol Use: No    Review of Systems  Unable to perform ROS: Mental status change  All other systems reviewed and are negative.     Allergies  Review of patient's allergies indicates no known allergies.  Home Medications   Prior to Admission medications   Medication Sig Start Date End Date Taking? Authorizing Provider  Multiple Vitamin (MULTIVITAMIN WITH MINERALS) TABS tablet Take 1 tablet by mouth daily.   Yes Historical Provider, MD  acyclovir (ZOVIRAX) 800 MG tablet One po qid Patient not taking: Reported on 01/29/2016 12/12/15   Lonia Skinner Sofia, PA-C   BP 128/62 mmHg  Pulse 107  Temp(Src) 98.4 F (36.9 C) (Oral)   Resp 14  SpO2 96% Physical Exam  Constitutional:  Tired, pale   HENT:  Head: Normocephalic.  MM dry   Eyes: Conjunctivae are normal. Pupils are equal, round, and reactive to light.  Neck: Normal range of motion. Neck supple.  Cardiovascular: Regular rhythm and normal heart sounds.   Tachycardic   Pulmonary/Chest: Effort normal.  Diminished bilateral bases   Abdominal: Soft.  Slightly distended, nontender.   Musculoskeletal: Normal range of motion. He exhibits no edema or tenderness.  Neurological: He is alert.  Tired, confused. Moving all extremities. Nl strength throughout   Skin: Skin is warm and dry.  Psychiatric:  Unable   Nursing note and vitals reviewed.   ED Course  Procedures (including critical care time) Labs Review Labs Reviewed  CBC WITH DIFFERENTIAL/PLATELET - Abnormal; Notable for the following:    WBC 12.5 (*)    RBC 3.11 (*)    Hemoglobin 9.5 (*)    HCT 28.6 (*)    RDW 16.0 (*)    Neutro Abs 10.9 (*)    All other components within normal limits  COMPREHENSIVE METABOLIC PANEL - Abnormal; Notable for the following:    Chloride 116 (*)    CO2 21 (*)    Glucose, Bld 206 (*)    BUN 75 (*)    Creatinine, Ser 2.06 (*)    Calcium 8.0 (*)    Total Protein 5.2 (*)    Albumin 2.8 (*)    ALT 16 (*)  Total Bilirubin 0.2 (*)    GFR calc non Af Amer 27 (*)    GFR calc Af Amer 31 (*)    All other components within normal limits  POC OCCULT BLOOD, ED - Abnormal; Notable for the following:    Fecal Occult Bld POSITIVE (*)    All other components within normal limits  URINE CULTURE  CULTURE, BLOOD (ROUTINE X 2)  CULTURE, BLOOD (ROUTINE X 2)  URINALYSIS, ROUTINE W REFLEX MICROSCOPIC (NOT AT [facility])  I-STAT TROPOININ, ED  I-STAT CG4 LACTIC ACID, ED  TYPE AND SCREEN    Imaging Review Ct Abdomen Pelvis Wo Contrast  01/29/2016  CLINICAL DATA:  Weakness, altered mental status, nausea, history diabetes mellitus, single episode of vomiting, initial encounter  EXAM: CT ABDOMEN AND PELVIS WITHOUT CONTRAST TECHNIQUE: Multidetector CT imaging of the abdomen and pelvis was performed following the standard protocol without IV contrast. Sagittal and coronal MPR images reconstructed from axial data set. Oral contrast was not administered. COMPARISON:  05/24/2015 FINDINGS: Lower chest: LEFT lower lobe infiltrate which could represent pneumonia or aspiration. Scattered peribronchial thickening. Hepatobiliary: Probable small cyst lateral segment LEFT lobe liver 13 mm previously 11 mm. Liver and gallbladder otherwise normal appearance. No biliary dilatation. Pancreas: Atrophic, otherwise unremarkable Spleen: Small, grossly normal appearance Adrenals/Urinary Tract: Diffuse adrenal thickening with probable small adenoma LEFT adrenal gland 21 x 11 mm image 32. Small renal cysts. No gross evidence of renal mass or hydronephrosis. Bladder and ureters unremarkable. Stomach/Bowel: Normal appendix. Stomach is mildly distended by food debris and air. No definite gastric wall thickening or mass. Minimal distal colonic diverticulosis without evidence of diverticulitis. Increased stool in rectum. Bowel loops otherwise unremarkable. Vascular/Lymphatic: Scattered atherosclerotic calcifications without aneurysm. No definite abdominal or pelvic adenopathy. Reproductive: N/A Other: RIGHT inguinal hernia containing fat. No mass, free air or free fluid. Musculoskeletal: Scattered degenerative changes spine. Osseous demineralization. IMPRESSION: Infiltrate versus aspiration LEFT lower lobe. And probable small LEFT adrenal adenoma. Distal colonic diverticulosis without evidence of diverticulitis. Increased stool in rectum. Small RIGHT inguinal hernia containing fat. Electronically Signed   By: Ulyses Southward M.D.   On: 01/29/2016 19:25   Dg Chest 2 View  01/29/2016  CLINICAL DATA:  Altered mental status, vomiting EXAM: CHEST  2 VIEW COMPARISON:  12/21/2015 FINDINGS: Lungs are clear.  No pleural effusion  or pneumothorax. The heart is normal in size. Visualized osseous structures are within normal limits. IMPRESSION: No evidence of acute cardiopulmonary disease. Electronically Signed   By: Charline Bills M.D.   On: 01/29/2016 18:25   Ct Head Wo Contrast  01/29/2016  CLINICAL DATA:  Altered mental status.  Weakness.  Nausea. EXAM: CT HEAD WITHOUT CONTRAST TECHNIQUE: Contiguous axial images were obtained from the base of the skull through the vertex without intravenous contrast. COMPARISON:  03/07/2015 head CT. FINDINGS: No evidence of parenchymal hemorrhage or extra-axial fluid collection. No mass lesion, mass effect, or midline shift. No CT evidence of acute infarction. Intracranial atherosclerosis. Generalized cerebral volume loss. Nonspecific stable subcortical and periventricular white matter hypodensity, most in keeping with chronic small vessel ischemic change. No ventriculomegaly. The visualized paranasal sinuses are essentially clear. The mastoid air cells are unopacified. No evidence of calvarial fracture. IMPRESSION: 1.  No evidence of acute intracranial abnormality. 2. Generalized cerebral atrophy and chronic small vessel ischemia. Electronically Signed   By: Delbert Phenix M.D.   On: 01/29/2016 19:22   Dg Abd 2 Views  01/29/2016  CLINICAL DATA:  Altered mental status, vomiting EXAM: ABDOMEN -  2 VIEW COMPARISON:  05/25/1999 60 FINDINGS: Nonobstructive bowel gas pattern. No evidence of free air on the lateral decubitus view. Mild degenerative changes of the lumbar spine. IMPRESSION: No evidence of small bowel obstruction or free air. Electronically Signed   By: Charline BillsSriyesh  Krishnan M.D.   On: 01/29/2016 18:25   I have personally reviewed and evaluated these images and lab results as part of my medical decision-making.   EKG Interpretation   Date/Time:  Friday Jan 29 2016 15:56:06 EDT Ventricular Rate:  105 PR Interval:  182 QRS Duration: 131 QT Interval:  336 QTC Calculation: 444 R Axis:    42 Text Interpretation:  Sinus tachycardia Multiform ventricular premature  complexes Right bundle branch block tachycardia new since previous   Confirmed by YAO  MD, DAVID ([id number]) on 01/29/2016 4:41:40 PM      MDM   Final diagnoses:  Vomiting   Francisco Roberts is a 82 y.o. male here with vomiting, AMS. Consider aspiration vs stroke vs UTI vs SBO. Will get labs, CT head/CT ab/pel, UA. Will hydrate and reassess.   7:41 PM Occ positive. Hg dropped to 9.5 from 11.6 last year. Given protonix in the ED. BP stable, tachycardia improved with IVF. CT showed possible aspiration pneumonia, ordered abx. Patient saw palliative care a year ago and patient made DNR and GI saw patient and felt its best not to do interventions on the patient. I discussed with internal medicine teaching serviced. Ordered cef/azithro and protonix. Will admit to tele.   Richardean Canalavid H Yao, MD 01/29/16 [phone]

## 2016-01-29 NOTE — ED Notes (Signed)
Pt from home via EMS with c/o weakness and altered mental status and nausea. Pt was initially 82/50, EMS hang 400ml NS 4mg  of zofran and his repeat 106/66. Pt is tachycardic with a hr of 140. Pt has a CBG of 260. Per pt's daughter, pt has had altered mental status x 0500 this morning. Pt is normally able to ambulate, feed himself, and is a an o x 4. Pt is currently not know the date, but is oriented otherwise. Pt had chili for dinner last night and experienced an episode of emesis after that which was dark brown.

## 2016-01-29 NOTE — ED Notes (Signed)
Bed: WA20 Expected date:  Expected time:  Means of arrival:  Comments: EMS- 82yo M, AMS/UTI?

## 2016-01-30 ENCOUNTER — Encounter (HOSPITAL_COMMUNITY): Payer: Self-pay | Admitting: Internal Medicine

## 2016-01-30 DIAGNOSIS — J984 Other disorders of lung: Secondary | ICD-10-CM

## 2016-01-30 DIAGNOSIS — N179 Acute kidney failure, unspecified: Secondary | ICD-10-CM

## 2016-01-30 DIAGNOSIS — J690 Pneumonitis due to inhalation of food and vomit: Secondary | ICD-10-CM

## 2016-01-30 DIAGNOSIS — B9689 Other specified bacterial agents as the cause of diseases classified elsewhere: Secondary | ICD-10-CM

## 2016-01-30 DIAGNOSIS — E119 Type 2 diabetes mellitus without complications: Secondary | ICD-10-CM

## 2016-01-30 DIAGNOSIS — N390 Urinary tract infection, site not specified: Secondary | ICD-10-CM

## 2016-01-30 DIAGNOSIS — L8990 Pressure ulcer of unspecified site, unspecified stage: Secondary | ICD-10-CM | POA: Insufficient documentation

## 2016-01-30 LAB — CBC
HCT: 26.8 % — ABNORMAL LOW (ref 39.0–52.0)
HEMATOCRIT: 25.7 % — AB (ref 39.0–52.0)
Hemoglobin: 8.6 g/dL — ABNORMAL LOW (ref 13.0–17.0)
Hemoglobin: 8.3 g/dL — ABNORMAL LOW (ref 13.0–17.0)
MCH: 28.9 pg (ref 26.0–34.0)
MCH: 29.4 pg (ref 26.0–34.0)
MCHC: 32.1 g/dL (ref 30.0–36.0)
MCHC: 32.3 g/dL (ref 30.0–36.0)
MCV: 89.9 fL (ref 78.0–100.0)
MCV: 91.1 fL (ref 78.0–100.0)
PLATELETS: 160 10*3/uL (ref 150–400)
Platelets: 163 10*3/uL (ref 150–400)
RBC: 2.98 MIL/uL — ABNORMAL LOW (ref 4.22–5.81)
RBC: 2.82 MIL/uL — ABNORMAL LOW (ref 4.22–5.81)
RDW: 16 % — AB (ref 11.5–15.5)
RDW: 16.2 % — AB (ref 11.5–15.5)
WBC: 8.7 10*3/uL (ref 4.0–10.5)
WBC: 7 10*3/uL (ref 4.0–10.5)

## 2016-01-30 LAB — BASIC METABOLIC PANEL
Anion gap: 8 (ref 5–15)
BUN: 63 mg/dL — AB (ref 6–20)
CHLORIDE: 117 mmol/L — AB (ref 101–111)
CO2: 20 mmol/L — ABNORMAL LOW (ref 22–32)
CREATININE: 1.86 mg/dL — AB (ref 0.61–1.24)
Calcium: 8.2 mg/dL — ABNORMAL LOW (ref 8.9–10.3)
GFR, EST AFRICAN AMERICAN: 35 mL/min — AB (ref >60)
GFR, EST NON AFRICAN AMERICAN: 30 mL/min — AB (ref >60)
GLUCOSE: 132 mg/dL — AB (ref 65–99)
Potassium: 4.4 mmol/L (ref 3.5–5.1)
SODIUM: 145 mmol/L (ref 135–145)

## 2016-01-30 LAB — GLUCOSE, CAPILLARY
GLUCOSE-CAPILLARY: 93 mg/dL (ref 65–99)
GLUCOSE-CAPILLARY: 125 mg/dL — AB (ref 65–99)
GLUCOSE-CAPILLARY: 144 mg/dL — AB (ref 65–99)
Glucose-Capillary: 95 mg/dL (ref 65–99)
Glucose-Capillary: 105 mg/dL — ABNORMAL HIGH (ref 65–99)
Glucose-Capillary: 133 mg/dL — ABNORMAL HIGH (ref 65–99)

## 2016-01-30 MED ORDER — SODIUM CHLORIDE 0.9% FLUSH
3.0000 mL | Freq: Two times a day (BID) | INTRAVENOUS | Status: DC
  Administered 2016-01-30: 3 mL via INTRAVENOUS
  Administered 2016-01-30: 10 mL via INTRAVENOUS
  Administered 2016-01-31 – 2016-02-01 (×3): 3 mL via INTRAVENOUS

## 2016-01-30 MED ORDER — DEXTROSE 5 % IV SOLN: 1.0000 g | INTRAVENOUS | Status: DC

## 2016-01-30 MED ORDER — SODIUM CHLORIDE 0.9 % IV SOLN
INTRAVENOUS | Status: AC
  Administered 2016-01-30: 02:00:00 via INTRAVENOUS

## 2016-01-30 MED ORDER — DEXTROSE 5 % IV SOLN: 500.0000 mg | INTRAVENOUS | Status: DC

## 2016-01-30 MED ORDER — PANTOPRAZOLE SODIUM 40 MG IV SOLR
40.0000 mg | Freq: Two times a day (BID) | INTRAVENOUS | Status: DC
  Administered 2016-01-30 – 2016-02-01 (×5): 40 mg via INTRAVENOUS
  Filled 2016-01-30 (×5): qty 40

## 2016-01-30 MED ORDER — ONDANSETRON HCL 4 MG/2ML IJ SOLN: 4.0000 mg | Freq: Four times a day (QID) | INTRAMUSCULAR | Status: DC | PRN

## 2016-01-30 MED ORDER — ONDANSETRON HCL 4 MG PO TABS: 4.0000 mg | ORAL_TABLET | Freq: Four times a day (QID) | ORAL | Status: DC | PRN

## 2016-01-30 MED ORDER — AMPICILLIN-SULBACTAM SODIUM 1.5 (1-0.5) G IJ SOLR
1.5000 g | Freq: Two times a day (BID) | INTRAMUSCULAR | Status: DC
  Filled 2016-01-30: qty 1.5

## 2016-01-30 MED ORDER — SODIUM CHLORIDE 0.9 % IV SOLN
80.0000 mg | Freq: Once | INTRAVENOUS | Status: AC
  Administered 2016-01-30: 80 mg via INTRAVENOUS
  Filled 2016-01-30: qty 80

## 2016-01-30 MED ORDER — INSULIN ASPART 100 UNIT/ML ~~LOC~~ SOLN
0.0000 [IU] | SUBCUTANEOUS | Status: DC
  Administered 2016-01-30 – 2016-01-31 (×3): 1 [IU] via SUBCUTANEOUS
  Administered 2016-01-31: 2 [IU] via SUBCUTANEOUS
  Administered 2016-01-31: 1 [IU] via SUBCUTANEOUS
  Administered 2016-02-01: 2 [IU] via SUBCUTANEOUS

## 2016-01-30 MED ORDER — AMPICILLIN-SULBACTAM SODIUM 1.5 (1-0.5) G IJ SOLR
1.5000 g | INTRAMUSCULAR | Status: AC
  Administered 2016-01-30: 1.5 g via INTRAVENOUS
  Filled 2016-01-30: qty 1.5

## 2016-01-30 NOTE — Progress Notes (Signed)
Pharmacy Antibiotic Note  Francisco Roberts is a 82 y.o. male admitted on 01/29/2016 with asp pna.  Pharmacy has been consulted for Unasyn dosing.  Plan: Unasyn 1.5gm Iv q12h Will f/u micro data, renal function, and pt's clinical condition   Weight: 186 lb 1.1 oz (84.4 kg)  Temp (24hrs), Avg:98.6 F (37 C), Min:98.4 F (36.9 C), Max:98.7 F (37.1 C)   Recent Labs Lab 01/29/16 1708 01/29/16 1950 01/29/16 2356  WBC 12.5*  --   --   CREATININE 2.06*  --   --   LATICACIDVEN  --  1.39 0.62    Estimated Creatinine Clearance: 26.4 mL/min (by C-G formula based on Cr of 2.06).    No Known Allergies  Antimicrobials this admission: 5/19  Rocephin x 1 5/19 Azithro x 1 5/20  Unasyn >>   Microbiology results: 5/19 BCx x2:  5/19 UCx:    Thank you for allowing pharmacy to be a part of this patient's care.  Christoper Fabianaron Ford Peddie, PharmD, BCPS Clinical pharmacist, pager [phone] 01/30/2016 2:12 AM

## 2016-01-30 NOTE — Progress Notes (Signed)
Subjective: No events overnight. Patient resting comfortably in bed. Easily arousbale to voice. Only alert to person. Voices no complaints this morning. No further episodes of emesis. No gross blood in his stools.   Objective: Vital signs in last 24 hours: Filed Vitals:   01/30/16 0000 01/30/16 0117 01/30/16 0435 01/30/16 0751  BP: 127/95 124/60 128/52 124/51  Pulse: 110 103 100 106  Temp:  98.7 F (37.1 C) 98.6 F (37 C) 98.8 F (37.1 C)  TempSrc:  Oral Oral Oral  Resp: Weight:  186 lb 1.1 oz (84.4 kg)    SpO2: 97% 98% 100% 97%   Weight change:   Intake/Output Summary (Last 24 hours) at 01/30/16 1407 Last data filed at 01/30/16 0900  Gross per 24 hour  Intake 668.75 ml  Output      0 ml  Net 668.75 ml    Constitutional: He appears chronically ill. No distress. Alert to person only.  HENT:  Head: Normocephalic and atraumatic.  Mouth/Throat: Mucous membranes are moist. No oropharyngeal exudate.  Multiple missing teeth  Eyes: EOM are normal.  Irregular fixed dilated left pupil, right pupil constricted  Neck: Normal range of motion.  Cardiovascular: Regular rhythm and intact distal pulses.Slight tachycardia. No m/r/g. Pulmonary/Chest: Effort normal. No respiratory distress. He has no wheezes. He has no rales.  Abdominal: Soft. Bowel sounds are normal. He exhibits no distension. There is no tenderness. There is no guarding.  Musculoskeletal: He exhibits no edema or tenderness.  Skin: Skin is warm. He is not diaphoretic. No pallor.  Psychiatric: He has a normal mood and affect.   Medications: I have reviewed the patient's current medications. Scheduled Meds: . insulin aspart  0-9 Units Subcutaneous Q4H  . pantoprazole (PROTONIX) IV  40 mg Intravenous Q12H  . sodium chloride flush  3 mL Intravenous Q12H   Continuous Infusions:  PRN Meds:.ondansetron **OR** ondansetron (ZOFRAN) IV Assessment/Plan:  GI Bleed: Patient with reported hematemesis versus  chili/lasagna emesis, melena, +FOBT, and BUN of 75 suggestive of upper GI bleed which is likely cause of his initial hypotension and tachycardia due to volume loss. Hgb was 9.5 on admission compared to baseline of ~11. Hgb 8.3 this morning after receiving IVF overnight. Similar episode in September but complicated by SBO at that time. CT abdomen here shows some gastric distention but no evidence of SBO.  Spoke with granddaughter at bedside. She reports she would rather opt for conservative therapy and not have GI involvement for any possible EGD at this time. If he were to worsen with declining Hgb she would be agreeable to intervention at that time. She reports that he lives at home with her and has never been on hospice in the past.  No further emetic episodes overnight or today. Patient started on protonix, IVF, and will monitor hemoglobin and transfuse with PRBCs as indicated. If able to tolerate PO intake with no further episodes of emesis and stable Hgb will discharge home with daughter in 1-2 days.  -Pantoprazole 40 mg IV bid -NS @ 75 mL/hr -Monitor Hgb and for further bleeding episodes -Transfuse if Hgb <7.0 -Ondansetron prn nausea -Consider GI consult if further bleeding -SLP DYS 1, advance diet as tolerated  Possible Aspiration Pneumonia: LLL infiltrate vs aspiration seen on CT abdomen. Patient did have emetic episodes and at risk for aspiration. He does not have other symptoms that are suggestive of pneumonia. He was started on Ceftriaxone and Azithromycin at [facility] ED. Will discontinue ABX and  monitor for signs/symptoms of infection. If spikes a fever/develops respiratory symptoms can resume ABX therapy.  -D/C IV Ampicillin-sulbactam  UTI: Urine studies positive for leukocytes, nitrite negative, numerous WBCs.Patient unable to voice any urinary complaints.  -d/c abx -f/u urine and blood cultures  AKI: SCr improved today, 2.06 > 1.86. Basseline ~1.7. Likely prerenal from volume loss. -d/c  IVF, patient tolerating PO intake -f/u BMET  T2DM: Hgb A1C 6.7 on 12/29/2015, not on medications for this. -SSI-S  Diet: Dysphagia 1 Carb modified  DVT ppx: SCDs  Code: DNR   Dispo: Disposition is deferred at this time, awaiting improvement of current medical problems.  Anticipated discharge in approximately 1-2 day(s).   The patient does not have a current PCP (No primary care provider on file.) and [facility] after discharge.  The patient does not have transportation limitations that hinder transportation to clinic appointments.  .Services Needed at time of discharge: Y = Yes, Blank = No PT:   OT:   RN:   Equipment:   Other:     LOS: 1 day   Valentino NoseNathan Alexina Niccoli, MD [facility] PGY-1 [phone] 01/30/2016, 2:07 PM

## 2016-01-30 NOTE — H&P (Signed)
Date: 01/30/2016               Patient Name:  Francisco Roberts MRN: [id number]  DOB: 07-12-1922 Age / Sex: 82 y.o., male   PCP: No primary care provider on file.         Medical Service: Internal Medicine Teaching Service         Attending Physician: Dr. Tyson Aliasuncan Thomas Vincent, MD    First Contact: Dr. Valentino NoseNathan Boswell Pager: [phone]  Second Contact: Dr. Hyacinth Meekerasrif Ahmed Pager: [phone]       After Hours (After 5p/  First Contact Pager: [phone]  weekends / holidays): Second Contact Pager: [phone]   Chief Complaint: Vomiting blood  History of Present Illness:  Francisco Roberts is a 82 year old gentleman with PMH of T2DM, CKD Stage 3, history of GI bleed, and glaucoma who presented to [facility] ED with nausea and vomiting. Patient lives with his granddaughter who provides majority of the history. Patient had chili for dinner the night prior to ED visit. The next morning he had an emetic episode that appeared brown and looked to have some beans from prior dinner mixed in. He had an episode of formed bowel incontinence that appeared brown. He also had urinary incontinence which looked cloudy. Daughter states that he looked pale and became weak. She states that his abdomen appeared distended and tense. [facility], but felt that patient began to worsen and called EMS.  Per chart, EMS noted that patient was tachycardic and hypotensive to 82/50. He was given 400 cc NS with improved BP of 134/70 on ED arrival. Patient vomited again in the ED which appeared to be bloody per Granddaughter and had melenic appearing loose stools. Workup in the ED revealed a positive FOBT, Hgb 9.5 compared to baseline around 11, WBC 12.5, BUN 75, SCr 2.06, and urine study suggestive of UTI. Plain films of chest and abdomen were without evidence of cardiopulmonary disease or bowel obstruction. Head CT was without acute abnormality. CT abdomen/pelvis showed changes in the left lower lobe suggestive of  infiltrate versus aspiration. He was given Protonix, 1 L NS bolus, and started on Cetriaxone and Azithromycin. Blood and urine cultures were collected. [facility].  Per granddaughter, at baseline patient is alert, can identify family members, oriented to person and place (not month/day or president), feeds self, and ambulates short distance with cane and assistance. When seen at bedside [facility], granddaughter reports that patient looks much improved, near baseline, including pallor and abdominal distension.  Of note, patient had hospitalization from 9/11-9/15 2016 for similar episode with UTI treated with Cipro/Flagyl, anemia secondary to GI bleed requiring PRBC transfusion, and small bowel obstruction. Patient was transitioned to palliative care and discharged to home hospice.  Meds: Current Facility-Administered Medications  Medication Dose Route Frequency Provider Last Rate Last Dose  . 0.9 %  sodium chloride infusion   Intravenous STAT Richardean Canalavid H Yao, MD 75 mL/hr at 01/30/16 0201    . ampicillin-sulbactam (UNASYN) 1.5 g in sodium chloride 0.9 % 50 mL IVPB  1.5 g Intravenous STAT Titus Mouldaron G Amend, RPH      . ampicillin-sulbactam (UNASYN) 1.5 g in sodium chloride 0.9 % 50 mL IVPB  1.5 g Intravenous Q12H Titus Mouldaron G Amend, RPH      . insulin aspart (novoLOG) injection 0-9 Units  0-9 Units Subcutaneous Q4H Su Hoffarly J Rivet, MD   0 Units at 01/30/16  0206  . ondansetron (ZOFRAN) tablet 4 mg  4 mg Oral Q6H PRN Carly J Rivet, MD       Or  . ondansetron (ZOFRAN) injection 4 mg  4 mg Intravenous Q6H PRN Carly J Rivet, MD      . sodium chloride flush (NS) 0.9 % injection 3 mL  3 mL Intravenous Q12H Su Hoff, MD   3 mL at 01/30/16 0207    Allergies: Allergies as of 01/29/2016  . (No Known Allergies)   Past Medical History  Diagnosis Date  . Diabetes mellitus without complication (HCC)   . Glaucoma    History reviewed. No pertinent past surgical history. No  family history on file. Social History   Social History  . Marital Status: Married    Spouse Name: N/A  . Number of Children: N/A  . Years of Education: N/A   Occupational History  . Not on file.   Social History Main Topics  . Smoking status: Former Games developer  . Smokeless tobacco: Former Neurosurgeon    Quit date: 12/28/1973  . Alcohol Use: No  . Drug Use: No  . Sexual Activity: Not on file   Other Topics Concern  . Not on file   Social History Narrative   Lives with granddaughter    Review of Systems: Review of Systems  Constitutional: Positive for malaise/fatigue. Negative for fever, chills and diaphoresis.  Respiratory: Negative for cough, hemoptysis, shortness of breath and wheezing.   Cardiovascular: Negative for chest pain, palpitations and leg swelling.  Gastrointestinal: Positive for nausea, vomiting and melena. Negative for abdominal pain, diarrhea, constipation and blood in stool.  Genitourinary: Negative for dysuria, frequency and hematuria.  Musculoskeletal: Negative for myalgias.  Neurological: Positive for weakness. Negative for dizziness, focal weakness, seizures, loss of consciousness and headaches.     Physical Exam: Blood pressure 124/60, pulse 103, temperature 98.7 F (37.1 C), temperature source Oral, resp. rate 19, weight 186 lb 1.1 oz (84.4 kg), SpO2 98 %. Physical Exam  Constitutional: He appears well-developed. No distress.  HENT:  Head: Normocephalic and atraumatic.  Mouth/Throat: Mucous membranes are dry. No oropharyngeal exudate.  Multiple missing teeth  Eyes: EOM are normal.  Irregular fixed dilated left pupil, right pupil constricted  Neck: Normal range of motion.  Cardiovascular: Regular rhythm and intact distal pulses.   Slight tachycardia  Pulmonary/Chest: Effort normal. No respiratory distress. He has no wheezes. He has no rales.  Abdominal: Soft. Bowel sounds are normal. He exhibits no distension. There is no tenderness. There is no  guarding.  Musculoskeletal: He exhibits no edema or tenderness.  Skin: Skin is warm. He is not diaphoretic. No pallor.  Psychiatric: He has a normal mood and affect.     Lab results: Basic Metabolic Panel:  Recent Labs  16/10/96 1708  NA 143  K 5.1  CL 116*  CO2 21*  GLUCOSE 206*  BUN 75*  CREATININE 2.06*  CALCIUM 8.0*   Liver Function Tests:  Recent Labs  01/29/16 1708  AST 19  ALT 16*  ALKPHOS 77  BILITOT 0.2*  PROT 5.2*  ALBUMIN 2.8*   No results for input(s): LIPASE, AMYLASE in the last 72 hours. No results for input(s): AMMONIA in the last 72 hours. CBC:  Recent Labs  01/29/16 1708  WBC 12.5*  NEUTROABS 10.9*  HGB 9.5*  HCT 28.6*  MCV 92.0  PLT 188   Cardiac Enzymes: No results for input(s): CKTOTAL, CKMB, CKMBINDEX, TROPONINI in the last 72 hours. BNP: No  results for input(s): PROBNP in the last 72 hours. D-Dimer: No results for input(s): DDIMER in the last 72 hours. CBG:  Recent Labs  01/30/16 0117  GLUCAP 95   Hemoglobin A1C: No results for input(s): HGBA1C in the last 72 hours. Fasting Lipid Panel: No results for input(s): CHOL, HDL, LDLCALC, TRIG, CHOLHDL, LDLDIRECT in the last 72 hours. Thyroid Function Tests: No results for input(s): TSH, T4TOTAL, FREET4, T3FREE, THYROIDAB in the last 72 hours. Anemia Panel: No results for input(s): VITAMINB12, FOLATE, FERRITIN, TIBC, IRON, RETICCTPCT in the last 72 hours. Coagulation: No results for input(s): LABPROT, INR in the last 72 hours. Urine Drug Screen: Drugs of Abuse  No results found for: LABOPIA, COCAINSCRNUR, LABBENZ, AMPHETMU, THCU, LABBARB  Alcohol Level: No results for input(s): ETH in the last 72 hours. Urinalysis:  Recent Labs  01/29/16 2020  COLORURINE YELLOW  LABSPEC 1.021  PHURINE 5.5  GLUCOSEU NEGATIVE  HGBUR MODERATE*  BILIRUBINUR NEGATIVE  KETONESUR NEGATIVE  PROTEINUR 30*  NITRITE NEGATIVE  LEUKOCYTESUR LARGE*     Imaging results:  Ct Abdomen Pelvis  Wo Contrast  01/29/2016  CLINICAL DATA:  Weakness, altered mental status, nausea, history diabetes mellitus, single episode of vomiting, initial encounter EXAM: CT ABDOMEN AND PELVIS WITHOUT CONTRAST TECHNIQUE: Multidetector CT imaging of the abdomen and pelvis was performed following the standard protocol without IV contrast. Sagittal and coronal MPR images reconstructed from axial data set. Oral contrast was not administered. COMPARISON:  05/24/2015 FINDINGS: Lower chest: LEFT lower lobe infiltrate which could represent pneumonia or aspiration. Scattered peribronchial thickening. Hepatobiliary: Probable small cyst lateral segment LEFT lobe liver 13 mm previously 11 mm. Liver and gallbladder otherwise normal appearance. No biliary dilatation. Pancreas: Atrophic, otherwise unremarkable Spleen: Small, grossly normal appearance Adrenals/Urinary Tract: Diffuse adrenal thickening with probable small adenoma LEFT adrenal gland 21 x 11 mm image 32. Small renal cysts. No gross evidence of renal mass or hydronephrosis. Bladder and ureters unremarkable. Stomach/Bowel: Normal appendix. Stomach is mildly distended by food debris and air. No definite gastric wall thickening or mass. Minimal distal colonic diverticulosis without evidence of diverticulitis. Increased stool in rectum. Bowel loops otherwise unremarkable. Vascular/Lymphatic: Scattered atherosclerotic calcifications without aneurysm. No definite abdominal or pelvic adenopathy. Reproductive: N/A Other: RIGHT inguinal hernia containing fat. No mass, free air or free fluid. Musculoskeletal: Scattered degenerative changes spine. Osseous demineralization. IMPRESSION: Infiltrate versus aspiration LEFT lower lobe. And probable small LEFT adrenal adenoma. Distal colonic diverticulosis without evidence of diverticulitis. Increased stool in rectum. Small RIGHT inguinal hernia containing fat. Electronically Signed   By: Ulyses Southward M.D.   On: 01/29/2016 19:25   Dg Chest 2  View  01/29/2016  CLINICAL DATA:  Altered mental status, vomiting EXAM: CHEST  2 VIEW COMPARISON:  12/21/2015 FINDINGS: Lungs are clear.  No pleural effusion or pneumothorax. The heart is normal in size. Visualized osseous structures are within normal limits. IMPRESSION: No evidence of acute cardiopulmonary disease. Electronically Signed   By: Charline Bills M.D.   On: 01/29/2016 18:25   Ct Head Wo Contrast  01/29/2016  CLINICAL DATA:  Altered mental status.  Weakness.  Nausea. EXAM: CT HEAD WITHOUT CONTRAST TECHNIQUE: Contiguous axial images were obtained from the base of the skull through the vertex without intravenous contrast. COMPARISON:  03/07/2015 head CT. FINDINGS: No evidence of parenchymal hemorrhage or extra-axial fluid collection. No mass lesion, mass effect, or midline shift. No CT evidence of acute infarction. Intracranial atherosclerosis. Generalized cerebral volume loss. Nonspecific stable subcortical and periventricular white matter hypodensity, most  in keeping with chronic small vessel ischemic change. No ventriculomegaly. The visualized paranasal sinuses are essentially clear. The mastoid air cells are unopacified. No evidence of calvarial fracture. IMPRESSION: 1.  No evidence of acute intracranial abnormality. 2. Generalized cerebral atrophy and chronic small vessel ischemia. Electronically Signed   By: Delbert Phenix M.D.   On: 01/29/2016 19:22   Dg Abd 2 Views  01/29/2016  CLINICAL DATA:  Altered mental status, vomiting EXAM: ABDOMEN - 2 VIEW COMPARISON:  05/25/1999 60 FINDINGS: Nonobstructive bowel gas pattern. No evidence of free air on the lateral decubitus view. Mild degenerative changes of the lumbar spine. IMPRESSION: No evidence of small bowel obstruction or free air. Electronically Signed   By: Charline Bills M.D.   On: 01/29/2016 18:25    Other results: EKG: Sinus tachycardia, RBBB, PVCs, similar to prior.  Assessment & Plan by Problem: Principal Problem:   GI  bleed Active Problems:   Diabetes mellitus with chronic kidney disease (HCC)   UTI (urinary tract infection)   Aspiration pneumonia (HCC)  82 year old gentleman with PMH of T2DM, CKD Stage 3, history of GI bleed, and glaucoma who presents with hematemesis, UTI, and possible aspiration pneumonia.  GI Bleed: Patient with reported hematemesis versus chili/lasagna emesis, melena, +FOBT, and BUN of 75 suggestive of upper GI bleed which is likely cause of his initial hypotension and tachycardia due to volume loss. Hgb is 9.5 compared to baseline of ~11. Patient had similar episode in September. At that time, patient was moved toward palliative care and further investigation with upper/lower endoscopy were not pursued. Family reports no similar episodes in the interim. He has no further emetic episodes since arrival to [facility]. Patient started on protonix, IVF, and will monitor hemoglobin and transfuse with PRBCs as indicated. -Pantoprazole 80 mg IV once -NS @ 75 mL/hr -Monitor Hgb and for further bleeding episodes -Transfuse if Hgb <7.0 -Ondansetron prn nausea -Consider GI consult if further bleeding  Possible Aspiration Pneumonia: LLL infiltrate vs aspiration seen on CT abdomen. Patient did have emetic episodes and at risk for aspiration. He does not have other symptoms that are suggestive of pneumonia. He was started on Ceftriaxone and Azithromycin at [facility] ED. Will switch to cover for possible aspiration PNA and UTI. -D/c Ceft/Azithro -Start IV Ampicillin-sulbactam   UTI: Urine studies positive for leukocytes, nitrite negative, numerous WBCs. -Ampicillin-sulbactam as above -f/u urine and blood cultures  AKI: SCr increased to 2.06 from baseline ~1.7. Likely prerenal from volume loss. -IVF as above -f/u BMET  T2DM: Hgb A1C 6.7 on 12/29/2015, not on medications for this. -SSI-S  Diet: NPO for now  DVT ppx: SCDs  Code: Previously DNR and on home hospice. After long discussion with  granddaughter, they have agreed to DNR status at this time, but expressed consideration for aggressive medical therapy. Will need to continue to address as family continue to contemplate goals of care.   Dispo: Disposition is deferred at this time, awaiting improvement of current medical problems. Anticipated discharge in approximately 2-3 day(s).   The patient does have a current PCP ([facility]) and does need an [facility] hospital follow-up appointment after discharge.  The patient does have transportation limitations that hinder transportation to clinic appointments.  Signed: Darreld Mclean, MD 01/30/2016, 3:13 AM

## 2016-01-30 NOTE — Plan of Care (Signed)
Problem: Nutrition: Goal: Adequate nutrition will be maintained Outcome: Progressing Eating 100% of breakfast and lunch

## 2016-01-30 NOTE — Plan of Care (Signed)
Problem: Nutrition: Goal: Adequate nutrition will be maintained Outcome: Not Progressing Pending ST eval for swallowing.

## 2016-01-30 NOTE — Evaluation (Signed)
Clinical/Bedside Swallow Evaluation Patient Details  Name: Francisco Roberts MRN: [id number] Date of Birth: 14-Jun-1922  Today's Date: 01/30/2016 Time: SLP Start Time (ACUTE ONLY): 4098 SLP Stop Time (ACUTE ONLY): 0902 SLP Time Calculation (min) (ACUTE ONLY): 23 min  Past Medical History:  Past Medical History  Diagnosis Date  . Diabetes mellitus without complication (HCC)   . Glaucoma    Past Surgical History: History reviewed. No pertinent past surgical history. HPI:  Pt is a 82 year old gentleman with PMH of T2DM, CKD Stage 3, history of GI bleed, and glaucoma who presents with hematemesis, UTI, and possible aspiration pneumonia. LLL infiltrate vs aspiration seen on CT abdomen   Assessment / Plan / Recommendation Clinical Impression  Pt presents with moderate oropharyngeal dysphagia. Bedside swallow evaluation revealed signs and symptoms concerning for poor airway protection. Note CT of abdomen significant for, LEFT lower lobe infiltrate which could represent pneumonia or aspiration. Deficits this date characterized by immediate and delayed congested cough following isolated trials of ice chips, thin, and nectar thick liquids. No overt signs or symptoms of aspiration with honey thick liquids. Pts worsening mentation since acute visit puts him at increased risk for aspiration. Pts primarily edentulous oral cavity prohibited functional mastication of solid PO. Given acute onset of PNA, symptomatic BSE, and reduced cognitive status,  recommend conservative diet initiation of dysphagia 1 (puree) and honey thick liquids. ST to follow up for upgraded PO trials.     Aspiration Risk  Moderate aspiration risk    Diet Recommendation   Dysphagia 1 (puree) and honey thick liquids   Medication Administration: Crushed with puree    Other  Recommendations Oral Care Recommendations: Oral care BID Other Recommendations: Order thickener from pharmacy   Follow up Recommendations  24 hour supervision/assistance     Frequency and Duration min 2x/week  2 weeks       Prognosis Prognosis for Safe Diet Advancement: Fair Barriers to Reach Goals: Severity of deficits;Cognitive deficits      Swallow Study   General Date of Onset: 01/29/16 HPI: Pt is a 82 year old gentleman with PMH of T2DM, CKD Stage 3, history of GI bleed, and glaucoma who presents with hematemesis, UTI, and possible aspiration pneumonia. LLL infiltrate vs aspiration seen on CT abdomen Type of Study: Bedside Swallow Evaluation Diet Prior to this Study: NPO Temperature Spikes Noted: No Respiratory Status: Room air History of Recent Intubation: No Behavior/Cognition: Confused;Requires cueing Oral Cavity - Dentition: Other (Comment) (primarily edentulous ) Self-Feeding Abilities: Needs assist (secondary to worsnening mentation since acute visit ) Patient Positioning: Upright in bed Baseline Vocal Quality: Low vocal intensity Volitional Cough: Congested;Strong Volitional Swallow: Unable to elicit    Oral/Motor/Sensory Function Overall Oral Motor/Sensory Function: Generalized oral weakness   Ice Chips Ice chips: Impaired Presentation: Spoon Oral Phase Impairments: Reduced lingual movement/coordination Oral Phase Functional Implications: Prolonged oral transit Pharyngeal Phase Impairments: Suspected delayed Swallow;Multiple swallows;Cough - Immediate;Cough - Delayed   Thin Liquid Thin Liquid: Impaired Presentation: Cup;Spoon Oral Phase Impairments: Reduced lingual movement/coordination;Reduced labial seal Oral Phase Functional Implications: Prolonged oral transit Pharyngeal  Phase Impairments: Suspected delayed Swallow;Multiple swallows;Cough - Immediate;Cough - Delayed;Throat Clearing - Immediate    Nectar Thick Nectar Thick Liquid: Impaired Presentation: Cup;Spoon Oral Phase Impairments: Reduced labial seal Oral phase functional implications: Prolonged oral transit Pharyngeal Phase Impairments: Suspected delayed  Swallow;Multiple swallows;Cough - Delayed;Wet Vocal Quality   Honey Thick Honey Thick Liquid: Within functional limits   Puree Puree: Within functional limits   Solid   GO  Solid: Impaired Oral Phase Impairments: Impaired mastication;Reduced lingual movement/coordination Oral Phase Functional Implications: Prolonged oral transit;Impaired mastication Pharyngeal Phase Impairments: Suspected delayed Swallow;Multiple swallows       Marcene Duoshelsea Sumney MA, CCC-SLP Acute Care Speech Language Pathologist    [name], [name] 01/30/2016,9:17 AM

## 2016-01-30 NOTE — Plan of Care (Signed)
Problem: Nutrition: Goal: Adequate nutrition will be maintained Outcome: Progressing Speech evaluated swallowing. Pureed diet with honey thick liquid. Granddaughter at bedside, and assisted with breakfast, 100% consumed. Will continue to monitor.

## 2016-01-31 DIAGNOSIS — R4181 Age-related cognitive decline: Secondary | ICD-10-CM

## 2016-01-31 LAB — BASIC METABOLIC PANEL
ANION GAP: 10 (ref 5–15)
BUN: 52 mg/dL — ABNORMAL HIGH (ref 6–20)
CALCIUM: 8.6 mg/dL — AB (ref 8.9–10.3)
CHLORIDE: 113 mmol/L — AB (ref 101–111)
CO2: 21 mmol/L — AB (ref 22–32)
Creatinine, Ser: 1.65 mg/dL — ABNORMAL HIGH (ref 0.61–1.24)
GFR calc non Af Amer: 35 mL/min — ABNORMAL LOW (ref >60)
GFR, EST AFRICAN AMERICAN: 40 mL/min — AB (ref >60)
Glucose, Bld: 103 mg/dL — ABNORMAL HIGH (ref 65–99)
POTASSIUM: 4.4 mmol/L (ref 3.5–5.1)
Sodium: 144 mmol/L (ref 135–145)

## 2016-01-31 LAB — CBC
HEMATOCRIT: 24 % — AB (ref 39.0–52.0)
HEMATOCRIT: 26.4 % — AB (ref 39.0–52.0)
HEMOGLOBIN: 7.8 g/dL — AB (ref 13.0–17.0)
HEMOGLOBIN: 8.4 g/dL — AB (ref 13.0–17.0)
MCH: 29.8 pg (ref 26.0–34.0)
MCH: 29.3 pg (ref 26.0–34.0)
MCHC: 32.5 g/dL (ref 30.0–36.0)
MCHC: 31.8 g/dL (ref 30.0–36.0)
MCV: 91.6 fL (ref 78.0–100.0)
MCV: 92 fL (ref 78.0–100.0)
Platelets: 159 10*3/uL (ref 150–400)
Platelets: 166 10*3/uL (ref 150–400)
RBC: 2.62 MIL/uL — AB (ref 4.22–5.81)
RBC: 2.87 MIL/uL — ABNORMAL LOW (ref 4.22–5.81)
RDW: 16.4 % — ABNORMAL HIGH (ref 11.5–15.5)
RDW: 16.2 % — AB (ref 11.5–15.5)
WBC: 6.9 10*3/uL (ref 4.0–10.5)
WBC: 7.7 10*3/uL (ref 4.0–10.5)

## 2016-01-31 LAB — GLUCOSE, CAPILLARY
GLUCOSE-CAPILLARY: 119 mg/dL — AB (ref 65–99)
GLUCOSE-CAPILLARY: 160 mg/dL — AB (ref 65–99)
GLUCOSE-CAPILLARY: 167 mg/dL — AB (ref 65–99)
GLUCOSE-CAPILLARY: 169 mg/dL — AB (ref 65–99)
Glucose-Capillary: 109 mg/dL — ABNORMAL HIGH (ref 65–99)
Glucose-Capillary: 147 mg/dL — ABNORMAL HIGH (ref 65–99)
Glucose-Capillary: 149 mg/dL — ABNORMAL HIGH (ref 65–99)

## 2016-01-31 MED ORDER — SODIUM CHLORIDE 0.9 % IV SOLN
INTRAVENOUS | Status: AC
  Administered 2016-01-31: 09:00:00 via INTRAVENOUS

## 2016-01-31 NOTE — Progress Notes (Signed)
Internal Medicine Attending:   I saw and examined the patient. I reviewed the resident's note and I agree with the resident's findings and plan as documented in the resident's note.  82 year old man admitted for upper GI bleeding which has stopped spontaneously. He has moderate age-related cognitive impairment and is unable to talk with us about his condition. He has no complaints. No reports of further hematemesis from nursing yesterday or overnight. The housestaff talked with his family and they would prefer to avoid an EGD procedure if able and instead treat him medically. Because he has had no further bleeding, has no risk for varices, and is not on antiplatelet or anticoagulant, I think it is reasonable to treat conservatively. We can switch PPI to a high dose PO formulation for at least four weeks and then daily. Will need PT and OT evals to maximize home services, and if no further bleeding he may be able to go home with family support tomorrow.

## 2016-01-31 NOTE — Plan of Care (Signed)
Problem: Fluid Volume: Goal: Ability to maintain a balanced intake and output will improve Outcome: Progressing NS at 100 ml/hr initiated. Will continue to monitor..Marland Kitchen

## 2016-01-31 NOTE — Progress Notes (Addendum)
Subjective: No events overnight. Tachycardic this morning in low 100's. Remain only alert to person. Voices no complaints this morning. No further hematemesis.  Objective: Vital signs in last 24 hours: Filed Vitals:   01/30/16 0751 01/30/16 1659 01/30/16 2040 01/31/16 0412  BP: 124/51 112/42 130/45 139/59  Pulse: 106 81 95 121  Temp: 98.8 F (37.1 C) 97.7 F (36.5 C) 99.6 F (37.6 C) 99 F (37.2 C)  TempSrc: Oral Oral Oral Oral  Resp: 18 18 16 20   Weight:   187 lb 9.8 oz (85.1 kg)   SpO2: 97% 99% 97% 96%   Weight change: 1 lb 8.7 oz (0.7 kg)  Intake/Output Summary (Last 24 hours) at 01/31/16 0808 Last data filed at 01/31/16 0617  Gross per 24 hour  Intake    220 ml  Output    881 ml  Net   -661 ml    Constitutional: He appears chronically ill. No distress. Alert to person only.  HENT:  Head: Normocephalic and atraumatic.  Mouth/Throat: Mucous membranes are moist. No oropharyngeal exudate.  Multiple missing teeth  Eyes: EOM are normal.  Irregular fixed dilated left pupil, right pupil constricted  Neck: Normal range of motion.  Cardiovascular: Regular rhythm and intact distal pulses.Slight tachycardia. No m/r/g. Pulmonary/Chest: Effort normal. No respiratory distress. He has no wheezes. He has no rales.  Abdominal: Soft. Bowel sounds are normal. He exhibits no distension. There is no tenderness. There is no guarding.  Musculoskeletal: He exhibits no edema or tenderness.  Skin: Skin is warm. He is not diaphoretic. No pallor.  Psychiatric: He has a normal mood and affect.   Medications: I have reviewed the patient's current medications. Scheduled Meds: . insulin aspart  0-9 Units Subcutaneous Q4H  . pantoprazole (PROTONIX) IV  40 mg Intravenous Q12H  . sodium chloride flush  3 mL Intravenous Q12H   Continuous Infusions:  PRN Meds:.ondansetron **OR** ondansetron (ZOFRAN) IV Assessment/Plan:  GI Bleed: Patient with reported hematemesis versus chili/lasagna  emesis, melena, +FOBT, and BUN of 75 suggestive of upper GI bleed which is likely cause of his initial hypotension and tachycardia due to volume loss. Hgb was 9.5 on admission compared to baseline of ~11. Trending down slowly to 8.3 then 7.8 this morning. Similar episode in September but complicated by SBO at that time. CT abdomen here shows some gastric distention but no evidence of SBO.  Upon family discussion, it was decided that they would like conservative therapy and not have GI involvement for any possible EGD at this time. If he were to worsen with declining Hgb she would be agreeable to intervention at that time-Pantoprazole 40 mg IV bid for now.  -NS @ 100cc/hr to help with tachycardia.  -Monitor Hgb and for further bleeding episodes -Transfuse if Hgb <7.0 -Ondansetron prn nausea -Consider GI consult if further bleeding -SLP DYS 1, advance diet as tolerated - if stable tomorrow, may switch to PPI BID x4 weeks then daily after that and discharge home.   Possible Aspiration Pneumonia: LLL infiltrate vs aspiration seen on CT abdomen. Patient did have emetic episodes and at risk for aspiration. He does not have other symptoms that are suggestive of pneumonia. [facility]. Will discontinue ABX and monitor for signs/symptoms of infection. If spikes a fever/develops respiratory symptoms can resume ABX therapy.  -D/Ced IV Ampicillin-sulbactam  UTI: Urine studies positive for leukocytes, nitrite negative, numerous WBCs.Patient unable to voice any urinary complaints.  -d/ced abx -f/u urine  and blood cultures  AKI - resolved.  2.06 > 1.65. Basseline ~1.7. Likely prerenal from volume loss.  T2DM: Hgb A1C 6.7 on 12/29/2015, not on medications for this. -SSI-S  Diet: Dysphagia 1 Carb modified  DVT ppx: SCDs  Code: DNR  >> FULL code now per granddaughter.   Dispo: Disposition is deferred at this time, awaiting improvement of current medical problems.   Anticipated discharge in approximately 1-2 day(s).   The patient does not have a current PCP (No primary care provider on file.) and does need an [facility] hospital follow-up appointment after discharge.  The patient does not have transportation limitations that hinder transportation to clinic appointments.  .Services Needed at time of discharge: Y = Yes, Blank = No PT:   OT:   RN:   Equipment:   Other:     LOS: 2 days   Hyacinth Meeker, MD  01/31/2016, 8:08 AM

## 2016-02-01 DIAGNOSIS — K922 Gastrointestinal hemorrhage, unspecified: Secondary | ICD-10-CM

## 2016-02-01 LAB — CBC
HCT: 25.9 % — ABNORMAL LOW (ref 39.0–52.0)
HEMOGLOBIN: 8.1 g/dL — AB (ref 13.0–17.0)
MCH: 29.2 pg (ref 26.0–34.0)
MCHC: 31.3 g/dL (ref 30.0–36.0)
MCV: 93.5 fL (ref 78.0–100.0)
PLATELETS: 154 10*3/uL (ref 150–400)
RBC: 2.77 MIL/uL — AB (ref 4.22–5.81)
RDW: 16.2 % — ABNORMAL HIGH (ref 11.5–15.5)
WBC: 8.2 10*3/uL (ref 4.0–10.5)

## 2016-02-01 LAB — IRON AND TIBC
Iron: 50 ug/dL (ref 45–182)
Saturation Ratios: 17 % — ABNORMAL LOW (ref 17.9–39.5)
TIBC: 294 ug/dL (ref 250–450)
UIBC: 244 ug/dL

## 2016-02-01 LAB — URINE CULTURE

## 2016-02-01 LAB — GLUCOSE, CAPILLARY
GLUCOSE-CAPILLARY: 64 mg/dL — AB (ref 65–99)
GLUCOSE-CAPILLARY: 100 mg/dL — AB (ref 65–99)
Glucose-Capillary: 122 mg/dL — ABNORMAL HIGH (ref 65–99)
Glucose-Capillary: 114 mg/dL — ABNORMAL HIGH (ref 65–99)

## 2016-02-01 LAB — VITAMIN B12: VITAMIN B 12: 420 pg/mL (ref 180–914)

## 2016-02-01 LAB — FERRITIN: Ferritin: 72 ng/mL (ref 24–336)

## 2016-02-01 MED ORDER — GLUCOSE 40 % PO GEL
ORAL | Status: AC
  Administered 2016-02-01: 37.5 g
  Filled 2016-02-01: qty 1

## 2016-02-01 MED ORDER — PANTOPRAZOLE SODIUM 40 MG PO TBEC: 40.0000 mg | DELAYED_RELEASE_TABLET | Freq: Two times a day (BID) | ORAL | Status: DC

## 2016-02-01 NOTE — Care Management Important Message (Signed)
Important Message  Patient Details  Name: Francisco Roberts MRN: [id number] Date of Birth: 10/20/1921   Medicare Important Message Given:  Yes    Raegyn Renda, Annamarie Majorheryl U, RN 02/01/2016, 2:17 PM

## 2016-02-01 NOTE — Progress Notes (Addendum)
Subjective: No events overnight. Remain only alert to person. Voices no complaints this morning. No further hematemesis. Granddaughter at bedside reports he is in his usual state of health today.   Objective: Vital signs in last 24 hours: Filed Vitals:   01/31/16 1654 01/31/16 1926 02/01/16 0613 02/01/16 0842  BP: 118/43 135/67 151/59 133/55  Pulse: 88 92 88 86  Temp: 98.9 F (37.2 C) 99 F (37.2 C) 98.9 F (37.2 C) 98 F (36.7 C)  TempSrc: Oral Oral Oral Oral  Resp: 17 20 18 18   Weight:  191 lb 12.8 oz (87 kg)    SpO2: 98% 100% 100% 97%   Weight change: 4 lb 3 oz (1.9 kg)  Intake/Output Summary (Last 24 hours) at 02/01/16 1052 Last data filed at 02/01/16 0842  Gross per 24 hour  Intake   1950 ml  Output   1000 ml  Net    950 ml    Constitutional: He appears chronically ill. No distress. Alert to person only.  HENT:  Head: Normocephalic and atraumatic.  Mouth/Throat: Mucous membranes are moist. No oropharyngeal exudate.  Multiple missing teeth  Irregular fixed dilated left pupil, right pupil constricted  Cardiovascular: Regular rhythm and intact distal pulses.Regular rate. No m/r/g. Pulmonary/Chest: Effort normal. No respiratory distress. He has no wheezes. He has no rales.  Abdominal: Soft. Bowel sounds are normal. He exhibits no distension. There is no tenderness. There is no guarding.  Skin: Skin is warm. He is not diaphoretic. No pallor. Callous on bilateral plantar surfaces of feet.  Psychiatric: He has a normal mood and affect. Alert to person only.   Medications: I have reviewed the patient's current medications. Scheduled Meds: . insulin aspart  0-9 Units Subcutaneous Q4H  . pantoprazole (PROTONIX) IV  40 mg Intravenous Q12H  . sodium chloride flush  3 mL Intravenous Q12H   Continuous Infusions:  PRN Meds:.ondansetron **OR** ondansetron (ZOFRAN) IV Assessment/Plan:  GI Bleed: Patient with reported hematemesis after chili/lasagna dinner, melena, +FOBT,  and BUN of 75 suggestive of upper GI bleed which is likely cause of his initial hypotension and tachycardia due to volume loss. Hgb was 9.5 on admission compared to baseline of ~11. Trending down slowly to 8.3. Stable at 8.1 this morning. Similar episode in September but complicated by SBO at that time. CT abdomen here shows some gastric distention but no evidence of SBO.  Upon family discussion, it was decided that they would like conservative therapy and not have GI involvement for any possible EGD at this time. If he were to worsen with declining Hgb she would be agreeable to intervention at that time. -Pantoprazole 40 mg IV bid, change to PO bid for 4 weeks.  -D/C IVF -Monitor Hgb and for further bleeding episodes -Transfuse if Hgb <7.0 -Ondansetron prn nausea -Consider GI consult if further bleeding -SLP DYS 1, advance diet as tolerated -Plan for discharge today -Iron studies wnl  Aspiration Pneumonitis: LLL infiltrate vs aspiration seen on CT abdomen. Patient did have emetic episodes and at risk for aspiration. He does not have other symptoms that are suggestive of pneumonia. He [facility]. D/Cd ABX and monitoring for signs/symptoms of infection. He has remained afebrile since admission with no respiratory complaints. Lungs clear on exam.   Asymptomatic Bactiuria: Urine studies positive for leukocytes, nitrite negative, numerous WBCs.Patient denies any urinary complaints.  -d/ced abx (1 dose of CTX in ED) -Urine culture growing Coag Negative Staph.  -Blood cultures with  NGTD -f/u UA as an outpatient  AKI - resolved.  2.06 > 1.65. Basseline ~1.7. Likely prerenal from volume loss.  T2DM: CBGs well controlled. Hgb A1C 6.7 on 12/29/2015, not on medications for this. -SSI-S  Poor Dentition: Granddaughter concerned about lack of teeth and eating. She reports blending his food at home. Will need follow up outpatient for dentures.   Foot Callous:  Granddaughter concerned about callous on his feet causing difficulty with ambulation. Will need podiatry referral outpatient.   Diet: Dysphagia 1 Carb modified  DVT ppx: SCDs  Code: DNR  >> FULL code now per granddaughter.   Dispo: Discharge today.  The patient does not have a current PCP (No primary care provider on file.) and does need an [facility] hospital follow-up appointment after discharge.  The patient does not have transportation limitations that hinder transportation to clinic appointments.  .Services Needed at time of discharge: Y = Yes, Blank = No PT:   OT:   RN:   Equipment:   Other:     LOS: 3 days   Valentino Nose, MD  02/01/2016, 10:52 AM

## 2016-02-01 NOTE — Evaluation (Signed)
Physical Therapy Evaluation Patient Details Name: Francisco Roberts MRN: [id number] DOB: 1922/07/23 Today's Date: 02/01/2016   History of Present Illness  pt presents with Upper GI Bleed.  pt with hx of Dementia, DM, CKD, Glaucoma, and GIB.    Clinical Impression  Pt flat, but very agreeable to PT and mobility.  Granddaughter present and able to A with cueing and PLOF details.  Per daughter pt has an appointment later this month to get fitted for a W/C, however feel pt would benefit from W/C at D/C.  Unsure if pt is able to be fitted while on acute or if would need rental/loaner until he can get his own.      Follow Up Recommendations Home health PT;Supervision/Assistance - 24 hour    Equipment Recommendations  Wheelchair (measurements PT);Wheelchair cushion (measurements PT)    Recommendations for Other Services       Precautions / Restrictions Precautions Precautions: Fall Restrictions Weight Bearing Restrictions: No      Mobility  Bed Mobility Overal bed mobility: Needs Assistance Bed Mobility: Supine to Sit     Supine to sit: Max assist     General bed mobility comments: A with Bil LEs and bringing trunk up to sitting.    Transfers Overall transfer level: Needs assistance Equipment used: Rolling walker (2 wheeled) Transfers: Sit to/from UGI CorporationStand;Stand Pivot Transfers Sit to Stand: Min assist;+2 physical assistance Stand pivot transfers: Mod assist;+2 physical assistance       General transfer comment: pt needs A for power up to standing and anterior weightshift over BOS.  Increased A needed to pivot to recliner with pt leaning to R side.    Ambulation/Gait                Stairs            Wheelchair Mobility    Modified Rankin (Stroke Patients Only)       Balance Overall balance assessment: Needs assistance Sitting-balance support: Bilateral upper extremity supported;Feet supported Sitting balance-Leahy Scale: Fair     Standing balance support:  Bilateral upper extremity supported;During functional activity Standing balance-Leahy Scale: Poor                               Pertinent Vitals/Pain Pain Assessment: No/denies pain    Home Living Family/patient expects to be discharged to:: Private residence Living Arrangements: Other relatives (Granddaughter) Available Help at Discharge: Family;Personal care attendant;Available 24 hours/day Type of Home: House Home Access: Stairs to enter Entrance Stairs-Rails: None Entrance Stairs-Number of Steps: 2 (getting a ramp built) Home Layout: One level Home Equipment: Environmental consultantWalker - 2 wheels;Bedside commode;Shower seat;Hospital bed (Ordering a W/C) Additional Comments: pt's granddaughter is the primary caregiver, but he also has an Aide that is with him while the granddaughter is at work.    Prior Function Level of Independence: Needs assistance   Gait / Transfers Assistance Needed: Walks short distances only with RW and one person A.  Family does A with coming to standing.    ADL's / Homemaking Assistance Needed: Family and Aide A with all ADLs.          Hand Dominance        Extremity/Trunk Assessment   Upper Extremity Assessment: Defer to OT evaluation           Lower Extremity Assessment: Generalized weakness      Cervical / Trunk Assessment: Kyphotic  Communication   Communication: HOH  Cognition Arousal/Alertness:  Awake/alert Behavior During Therapy: Flat affect Overall Cognitive Status: History of cognitive impairments - at baseline                      General Comments      Exercises        Assessment/Plan    PT Assessment Patient needs continued PT services  PT Diagnosis Difficulty walking;Generalized weakness   PT Problem List Decreased strength;Decreased activity tolerance;Decreased balance;Decreased mobility;Decreased coordination;Decreased knowledge of use of DME  PT Treatment Interventions DME instruction;Gait  training;Functional mobility training;Therapeutic activities;Therapeutic exercise;Balance training;Patient/family education   PT Goals (Current goals can be found in the Care Plan section) Acute Rehab PT Goals Patient Stated Goal: Per granddaughter to return to home PT Goal Formulation: With family Time For Goal Achievement: 02/15/16 Potential to Achieve Goals: Good    Frequency Min 3X/week   Barriers to discharge        Co-evaluation               End of Session Equipment Utilized During Treatment: Gait belt Activity Tolerance: Patient tolerated treatment well Patient left: in chair;with call bell/phone within reach;with chair alarm set;with family/visitor present Nurse Communication: Mobility status         Time: 1610-9604 PT Time Calculation (min) (ACUTE ONLY): 24 min   Charges:   PT Evaluation $PT Eval Moderate Complexity: 1 Procedure PT Treatments $Therapeutic Activity: 8-22 mins   PT G CodesSunny Schlein, Dixon [phone] 02/01/2016, 9:38 AM

## 2016-02-01 NOTE — Progress Notes (Signed)
Francisco Roberts to be D/C'd Home per MD order.  Discussed prescriptions and follow up appointments with the patient's granddaughter d/t pt's baseline confusion. Medication list explained in detail. Family verbalized understanding.    Medication List    STOP taking these medications        acyclovir 800 MG tablet  Commonly known as:  ZOVIRAX      TAKE these medications        multivitamin with minerals Tabs tablet  Take 1 tablet by mouth daily.     pantoprazole 40 MG tablet  Commonly known as:  PROTONIX  Take 1 tablet (40 mg total) by mouth 2 (two) times daily.        Filed Vitals:   02/01/16 0613 02/01/16 0842  BP: 151/59 133/55  Pulse: 88 86  Temp: 98.9 F (37.2 C) 98 F (36.7 C)  Resp: 18 18    IV catheter discontinued intact. Site without signs and symptoms of complications. Dressing and pressure applied. Pt denies pain at this time. No complaints noted.  An After Visit Summary was printed and given to the patient. Patient will leave unit via stretcher by ambulance service.  Modena NunneryHubbard, Rainelle Sulewski C 02/01/2016 4:59 PM

## 2016-02-01 NOTE — Discharge Summary (Signed)
Name: Francisco Roberts MRN: [id number] DOB: Mar 26, 1922 82 y.o. PCP: No primary care provider on file.  Date of Admission: 01/29/2016  3:53 PM Date of Discharge: 02/01/2016 Attending Physician: Inez CatalinaEmily B Mullen, MD  Discharge Diagnosis: Principal Problem:   GI bleed Active Problems:   Diabetes mellitus with chronic kidney disease (HCC)   UTI (urinary tract infection)   Aspiration pneumonia (HCC)   Pressure ulcer  Discharge Medications:   Medication List    STOP taking these medications        acyclovir 800 MG tablet  Commonly known as:  ZOVIRAX      TAKE these medications        multivitamin with minerals Tabs tablet  Take 1 tablet by mouth daily.     pantoprazole 40 MG tablet  Commonly known as:  PROTONIX  Take 1 tablet (40 mg total) by mouth 2 (two) times daily.        Disposition and follow-up:   Francisco Roberts was discharged from [facility] in Stable condition.  At the hospital follow up visit please address:  GI Bleed: Hgb stable at 8-9. No further bleeding during hospital stay. Family elected for conservative therapy, no EGD. Started on Protonix 40 mg bid at discharge for 4 week course. Please assess for any further bleeding and check CBC.  Aspiration Pneumonitis: Initially treated with ABX in ED, no respiratory symptoms or signs of systemic infection. Stable on RA during hospital course. Please assess for any respiratory complaints. Lungs clear on exam at discharge.  Asymptomatic Bacteruria: Dirty UA. Received 1 dose of CTX and 1 dose of amp-sulbactim. Denies any urinary symptoms. Please check a UA and assess for any urinary symptoms. Consider ABX therapy if symptomatic.  CODE Status: Initially DNR but changed to FULL after conversation with granddaughter. Granddaughter is new primary caregiver after her mother passed away several months ago. Will need further discussion of Code Status with PCP in the future.  Poor Dentition: Referral for dentures Foot  Callous: Referral to podiatry   2.  Labs / imaging needed at time of follow-up: CBC  3.  Pending labs/ test needing follow-up: None  Follow-up Appointments: Follow-up Information    Follow up with Lars MassonJones, [place], MD On 02/05/2016.   Specialty:  Internal Medicine   Why:  1045 AM   Contact information:   [address] [place] [address] [phone]       Discharge Instructions: Discharge Instructions    Diet - low sodium heart healthy    Complete by:  As directed      Increase activity slowly    Complete by:  As directed            Consultations:    Procedures Performed:  Ct Abdomen Pelvis Wo Contrast  01/29/2016  CLINICAL DATA:  Weakness, altered mental status, nausea, history diabetes mellitus, single episode of vomiting, initial encounter EXAM: CT ABDOMEN AND PELVIS WITHOUT CONTRAST TECHNIQUE: Multidetector CT imaging of the abdomen and pelvis was performed following the standard protocol without IV contrast. Sagittal and coronal MPR images reconstructed from axial data set. Oral contrast was not administered. COMPARISON:  05/24/2015 FINDINGS: Lower chest: LEFT lower lobe infiltrate which could represent pneumonia or aspiration. Scattered peribronchial thickening. Hepatobiliary: Probable small cyst lateral segment LEFT lobe liver 13 mm previously 11 mm. Liver and gallbladder otherwise normal appearance. No biliary dilatation. Pancreas: Atrophic, otherwise unremarkable Spleen: Small, grossly normal appearance Adrenals/Urinary Tract: Diffuse adrenal thickening with probable small adenoma LEFT adrenal  gland 21 x 11 mm image 32. Small renal cysts. No gross evidence of renal mass or hydronephrosis. Bladder and ureters unremarkable. Stomach/Bowel: Normal appendix. Stomach is mildly distended by food debris and air. No definite gastric wall thickening or mass. Minimal distal colonic diverticulosis without evidence of diverticulitis. Increased stool in rectum. Bowel loops otherwise  unremarkable. Vascular/Lymphatic: Scattered atherosclerotic calcifications without aneurysm. No definite abdominal or pelvic adenopathy. Reproductive: N/A Other: RIGHT inguinal hernia containing fat. No mass, free air or free fluid. Musculoskeletal: Scattered degenerative changes spine. Osseous demineralization. IMPRESSION: Infiltrate versus aspiration LEFT lower lobe. And probable small LEFT adrenal adenoma. Distal colonic diverticulosis without evidence of diverticulitis. Increased stool in rectum. Small RIGHT inguinal hernia containing fat. Electronically Signed   By: Ulyses Southward M.D.   On: 01/29/2016 19:25   Dg Chest 2 View  01/29/2016  CLINICAL DATA:  Altered mental status, vomiting EXAM: CHEST  2 VIEW COMPARISON:  12/21/2015 FINDINGS: Lungs are clear.  No pleural effusion or pneumothorax. The heart is normal in size. Visualized osseous structures are within normal limits. IMPRESSION: No evidence of acute cardiopulmonary disease. Electronically Signed   By: Charline Bills M.D.   On: 01/29/2016 18:25   Ct Head Wo Contrast  01/29/2016  CLINICAL DATA:  Altered mental status.  Weakness.  Nausea. EXAM: CT HEAD WITHOUT CONTRAST TECHNIQUE: Contiguous axial images were obtained from the base of the skull through the vertex without intravenous contrast. COMPARISON:  03/07/2015 head CT. FINDINGS: No evidence of parenchymal hemorrhage or extra-axial fluid collection. No mass lesion, mass effect, or midline shift. No CT evidence of acute infarction. Intracranial atherosclerosis. Generalized cerebral volume loss. Nonspecific stable subcortical and periventricular white matter hypodensity, most in keeping with chronic small vessel ischemic change. No ventriculomegaly. The visualized paranasal sinuses are essentially clear. The mastoid air cells are unopacified. No evidence of calvarial fracture. IMPRESSION: 1.  No evidence of acute intracranial abnormality. 2. Generalized cerebral atrophy and chronic small vessel  ischemia. Electronically Signed   By: Delbert Phenix M.D.   On: 01/29/2016 19:22   Dg Abd 2 Views  01/29/2016  CLINICAL DATA:  Altered mental status, vomiting EXAM: ABDOMEN - 2 VIEW COMPARISON:  05/25/1999 62 FINDINGS: Nonobstructive bowel gas pattern. No evidence of free air on the lateral decubitus view. Mild degenerative changes of the lumbar spine. IMPRESSION: No evidence of small bowel obstruction or free air. Electronically Signed   By: Charline Bills M.D.   On: 01/29/2016 18:25   Admission HPI: Mr. Lewi Drost is a 82 year old gentleman with PMH of T2DM, CKD Stage 3, history of GI bleed, and glaucoma who presented to [facility] ED with nausea and vomiting. Patient lives with his granddaughter who provides majority of the history. Patient had chili for dinner the night prior to ED visit. The next morning he had an emetic episode that appeared brown and looked to have some beans from prior dinner mixed in. He had an episode of formed bowel incontinence that appeared brown. He also had urinary incontinence which looked cloudy. Daughter states that he looked pale and became weak. She states that his abdomen appeared distended and tense. She was going to make an appointment with [facility], but felt that patient began to worsen and called EMS.  Per chart, EMS noted that patient was tachycardic and hypotensive to 82/50. He was given 400 cc NS with improved BP of 134/70 on ED arrival. Patient vomited again in the ED which appeared to be bloody per Granddaughter and had  melenic appearing loose stools. Workup in the ED revealed a positive FOBT, Hgb 9.5 compared to baseline around 11, WBC 12.5, BUN 75, SCr 2.06, and urine study suggestive of UTI. Plain films of chest and abdomen were without evidence of cardiopulmonary disease or bowel obstruction. Head CT was without acute abnormality. CT abdomen/pelvis showed changes in the left lower lobe suggestive of infiltrate versus aspiration. He was given Protonix, 1 L NS  bolus, and started on Cetriaxone and Azithromycin. Blood and urine cultures were collected. He was transferred to [facility] for admission.  Per granddaughter, at baseline patient is alert, can identify family members, oriented to person and place (not month/day or president), feeds self, and ambulates short distance with cane and assistance. When seen at bedside at [facility], granddaughter reports that patient looks much improved, near baseline, including pallor and abdominal distension.  Of note, patient had hospitalization from 9/11-9/15 2016 for similar episode with UTI treated with Cipro/Flagyl, anemia secondary to GI bleed requiring PRBC transfusion, and small bowel obstruction. Patient was transitioned to palliative care and discharged to home hospice.  Hospital Course by problem list:  GI Bleed: Patient with reported hematemesis after chili/lasagna dinner, melena, +FOBT, and BUN of 75 suggestive of upper GI bleed which is likely cause of his initial hypotension and tachycardia due to volume loss. Hgb was 9.5 compared to baseline of ~11. Patient had similar episode in September. At that time, September but complicated by SBO at that time. CT abdomen here shows some gastric distention but no evidence of SBO. Family reports no similar episodes in the interim. Upon family discussion, it was decided that they would like conservative therapy and not have GI involvement for any possible EGD at this time. If he were to worsen with declining Hgb she would be agreeable to intervention at that time. He had no further emetic episodes since arrival to [facility]. Patient started on Protonix bid. Hgb was stable at 8.1 on discharge. No signs of active bleeding. Iron studies normal. Will discharge with 4 week course of Protonix 40 mg bid. Follow up in clinic.   Aspiration Pneumonitis: LLL infiltrate vs aspiration seen on CT abdomen. Patient did have emetic episodes and at risk for aspiration. He does not have  other symptoms that are suggestive of pneumonia. He was started on Ceftriaxone and Azithromycin at [facility] ED and changed to ampicllin-sulbactam on admission. Patient with no respiratory symptoms or signs of systemic infection and ABX were stopped the following day. Felt to be aspiration pneumonitis rather than a true aspiration pneumonia. Follow up in clinic.   Asymptomatic Bacteruria: Urine studies positive for leukocytes, nitrite negative, numerous WBCs. Patient denies any urinary complaints. Received 1 dose of CTX in ED and 1 dose of ampicillin-sulbactam at admission. Urine culture growing Coag Negative Staph. Blood cultures with NGTD. Did not treat further with no urinary complaints. Will follow up UA as an outpatient and treat if symptomatic.   AKI: SCr increased to 2.06 from baseline ~1.7. Likely prerenal from volume loss. Resolved with IVF. Cr 1.65 at discharge.   T2DM: Hgb A1C 6.7 on 12/29/2015, not on medications for this.  Poor Dentition: Granddaughter concerned about lack of teeth and eating. She reports blending his food at home. Will need follow up outpatient for dentures.   Foot Callous: Granddaughter concerned about callous on his feet causing difficulty with ambulation. Will need podiatry referral outpatient.   Code: DNR initially >> FULL code per granddaughter after further discussion. Will need continued discussion  of advanced directives as an outpatient with PCP.   Discharge Vitals:   BP 133/55 mmHg  Pulse 86  Temp(Src) 98 F (36.7 C) (Oral)  Resp 18  Wt 191 lb 12.8 oz (87 kg)  SpO2 97%  Discharge Labs:  Results for orders placed or performed during the hospital encounter of 01/29/16 (from the past 24 hour(s))  Glucose, capillary     Status: Abnormal   Collection Time: 01/31/16  4:53 PM  Result Value Ref Range   Glucose-Capillary 147 (H) 65 - 99 mg/dL  Glucose, capillary     Status: Abnormal   Collection Time: 01/31/16  7:24 PM  Result Value Ref Range    Glucose-Capillary 169 (H) 65 - 99 mg/dL  Glucose, capillary     Status: Abnormal   Collection Time: 01/31/16 11:37 PM  Result Value Ref Range   Glucose-Capillary 160 (H) 65 - 99 mg/dL  Glucose, capillary     Status: Abnormal   Collection Time: 02/01/16  3:11 AM  Result Value Ref Range   Glucose-Capillary 64 (L) 65 - 99 mg/dL  Glucose, capillary     Status: Abnormal   Collection Time: 02/01/16  3:56 AM  Result Value Ref Range   Glucose-Capillary 100 (H) 65 - 99 mg/dL  CBC     Status: Abnormal   Collection Time: 02/01/16  4:46 AM  Result Value Ref Range   WBC 8.2 4.0 - 10.5 K/uL   RBC 2.77 (L) 4.22 - 5.81 MIL/uL   Hemoglobin 8.1 (L) 13.0 - 17.0 g/dL   HCT 78.2 (L) 95.6 - 21.3 %   MCV 93.5 78.0 - 100.0 fL   MCH 29.2 26.0 - 34.0 pg   MCHC 31.3 30.0 - 36.0 g/dL   RDW 08.6 (H) 57.8 - 46.9 %   Platelets 154 150 - 400 K/uL  Glucose, capillary     Status: Abnormal   Collection Time: 02/01/16  8:13 AM  Result Value Ref Range   Glucose-Capillary 122 (H) 65 - 99 mg/dL  Ferritin     Status: None   Collection Time: 02/01/16  8:27 AM  Result Value Ref Range   Ferritin 72 24 - 336 ng/mL  Iron and TIBC     Status: Abnormal   Collection Time: 02/01/16  8:27 AM  Result Value Ref Range   Iron 50 45 - 182 ug/dL   TIBC 629 528 - 413 ug/dL   Saturation Ratios 17 (L) 17.9 - 39.5 %   UIBC 244 ug/dL  Vitamin K44     Status: None   Collection Time: 02/01/16  8:27 AM  Result Value Ref Range   Vitamin B-12 420 180 - 914 pg/mL  Glucose, capillary     Status: Abnormal   Collection Time: 02/01/16 12:14 PM  Result Value Ref Range   Glucose-Capillary 114 (H) 65 - 99 mg/dL    Signed: Valentino Nose, MD 02/01/2016, 2:48 PM    Services Ordered on Discharge: [facility] PT Equipment Ordered on Discharge: Wheelchair

## 2016-02-01 NOTE — Care Management Note (Signed)
Case Management Note  Patient Details  Name: Francisco Roberts MRN: [id number] Date of Birth: 1922-04-11  Subjective/Objective:          CM following for progression and d/c planning.          Action/Plan: 02/01/2016 Noted orders for HHPT and wheelchair. This CM attempted to speak with pt who was asleep, call placed to Granddaughter, Francisco Roberts who is the caregiver. Ms [name] states [facility] and validated that she does need the wheelchair to transport this pt for distances. Wheelchair [facility]. Ms [name] also states that she spoke with the doctor this am and was told that the pt would d/c today. She will pick him up today after her work. [facility] notified of HHPT request and call placed for wheelchair to be delivered to room.   Expected Discharge Date:  02/01/2016              Expected Discharge Plan:  Home w Home Health Services  In-House Referral:  NA  Discharge planning Services  CM Consult  Post Acute Care Choice:  Durable Medical Equipment Choice offered to:  Adult Children  DME Arranged:  Government social research officerWheelchair manual DME Agency:  [facility]  HH Arranged:  PT HH Agency:  [facility]  Status of Service:  Completed, signed off  Medicare Important Message Given:    Date Medicare IM Given:    Medicare IM give by:    Date Additional Medicare IM Given:    Additional Medicare Important Message give by:     If discussed at Long Length of Stay Meetings, dates discussed:    Additional Comments:  Starlyn SkeansRoyal, Shamarion Coots U, RN 02/01/2016, 11:11 AM

## 2016-02-01 NOTE — Clinical Social Work Note (Signed)
Patient medically stable for discharge today and will be transported home by ambulance per family request.  CSW facilitated transport home and signing off.  Genelle BalVanessa Jacklin Zwick, MSW, LCSW Licensed Clinical Social Worker Clinical Social Work Department [facility] [phone]

## 2016-02-01 NOTE — Discharge Instructions (Addendum)
Your blood level has been stable the past two days. If you have any more episodes of bleeding please call the clinic or come to the ED for evaluation. I am starting you on a new medication called Protonix to help reduce the acid in your stomach. This well help heal any ulcers if that is what was causing the bleeding.  I would like you to follow up in clinic. I have scheduled you an appointment on Friday May 26th at 10:15 am. We will check your blood counts then to ensure no further bleeding. We can get you referrals for dentures and podiatry at that time.   If you have any questions or concerns please call the clinic at [phone]

## 2016-02-01 NOTE — Progress Notes (Signed)
Hypoglycemic Event  CBG: 64 Treatment: 1 tube instant glucose  Symptoms: None  Follow-up CBG: Time:03:56 CBG Result: 100  Possible Reasons for Event: Medication regimen: novolog  Comments/MD notified:Per hypoglycemia protocol    Francisco Roberts Joselita

## 2016-02-01 NOTE — Progress Notes (Signed)
Speech Language Pathology Treatment: Dysphagia  Patient Details Name: Francisco Roberts MRN: [id number] DOB: 1922/05/05 Today's Date: 02/01/2016 Time: 1350-1400 SLP Time Calculation (min) (ACUTE ONLY): 10 min  Assessment / Plan / Recommendation Clinical Impression  F/u after 5/20 swallow eval, after which dysphagia 1, honey-thick liquid diet was recommended.  Pt's liquids were advanced to thin 5/21.  When seated in upright position and cued for pacing, small bolus sizes, pt tolerated thin liquids with no s/s iof aspiration. Granddaughter has been pureeing foods at home per notes.  Recommend continued purees, thins upon D/C today.  No SLP f/u warranted.    HPI HPI: Pt is a 82 year old gentleman with PMH of T2DM, CKD Stage 3, history of GI bleed, and glaucoma who presents with hematemesis, UTI, and possible aspiration pneumonia. LLL infiltrate vs aspiration seen on CT abdomen      SLP Plan  All goals met     Recommendations  Diet recommendations: Dysphagia 1 (puree);Thin liquid Liquids provided via: Cup Medication Administration: Crushed with puree Supervision: Patient able to self feed Compensations: Minimize environmental distractions;Small sips/bites;Slow rate             Oral Care Recommendations: Oral care BID Follow up Recommendations: 24 hour supervision/assistance Plan: All goals met     GO                Juan Quam Laurice 02/01/2016, 2:01 PM

## 2016-02-01 NOTE — Progress Notes (Signed)
  Date: 02/01/2016  Patient name: Francisco Roberts  Medical record number: [id number]  Date of birth: [birth year]   This patient has been seen and the plan of care was discussed with the house staff. Please see Dr. Bonney RousselBoswell's note for complete details. I concur with his findings.  Inez CatalinaEmily B Jamir Rone, MD 02/01/2016, 3:21 PM

## 2016-02-02 LAB — FOLATE RBC
FOLATE, HEMOLYSATE: 300.1 ng/mL
Folate, RBC: 1168 ng/mL (ref >498)
HEMATOCRIT: 25.7 % — AB (ref 37.5–51.0)

## 2016-02-03 ENCOUNTER — Telehealth: Payer: Self-pay | Admitting: *Deleted

## 2016-02-03 LAB — CULTURE, BLOOD (ROUTINE X 2): CULTURE: NO GROWTH

## 2016-02-03 NOTE — Telephone Encounter (Signed)
PT advanced home calls and ask verbal approval for PT 2x week for 4 weeks, eval and treat for speech therapy- swallowing problems, please take special note of pt's feet at appt and recommend what you would like HH to do Do you approve? Pt may need a swallow test

## 2016-02-04 LAB — CULTURE, BLOOD (ROUTINE X 2): Culture: NO GROWTH

## 2016-02-04 NOTE — Telephone Encounter (Signed)
This sounds good. He will need a podiatry referral for his feet which it appears he has scheduled for 6/7 already.

## 2016-02-05 ENCOUNTER — Encounter: Payer: Self-pay | Admitting: Internal Medicine

## 2016-02-05 ENCOUNTER — Ambulatory Visit (INDEPENDENT_AMBULATORY_CARE_PROVIDER_SITE_OTHER): Payer: Medicare HMO | Admitting: Internal Medicine

## 2016-02-05 ENCOUNTER — Telehealth: Payer: Self-pay

## 2016-02-05 VITALS — BP 148/82 | HR 72 | Temp 97.8°F | Ht 73.0 in

## 2016-02-05 DIAGNOSIS — E1122 Type 2 diabetes mellitus with diabetic chronic kidney disease: Secondary | ICD-10-CM

## 2016-02-05 DIAGNOSIS — K922 Gastrointestinal hemorrhage, unspecified: Secondary | ICD-10-CM | POA: Diagnosis not present

## 2016-02-05 DIAGNOSIS — Z8701 Personal history of pneumonia (recurrent): Secondary | ICD-10-CM

## 2016-02-05 DIAGNOSIS — Z8719 Personal history of other diseases of the digestive system: Secondary | ICD-10-CM | POA: Diagnosis not present

## 2016-02-05 DIAGNOSIS — J690 Pneumonitis due to inhalation of food and vomit: Secondary | ICD-10-CM

## 2016-02-05 DIAGNOSIS — N3000 Acute cystitis without hematuria: Secondary | ICD-10-CM

## 2016-02-05 DIAGNOSIS — Z87440 Personal history of urinary (tract) infections: Secondary | ICD-10-CM

## 2016-02-05 DIAGNOSIS — N182 Chronic kidney disease, stage 2 (mild): Secondary | ICD-10-CM

## 2016-02-05 DIAGNOSIS — N183 Chronic kidney disease, stage 3 unspecified: Secondary | ICD-10-CM

## 2016-02-05 DIAGNOSIS — Z09 Encounter for follow-up examination after completed treatment for conditions other than malignant neoplasm: Secondary | ICD-10-CM | POA: Diagnosis not present

## 2016-02-05 NOTE — Patient Instructions (Signed)
1. Please make a follow up appointment for 2 weeks.   2. Please take all medications as previously prescribed with the following changes:  Continue Protonix twice daily.   I have placed referral for home health occupational therapy and a home health nurse.   3. If you have worsening of your symptoms or new symptoms arise, please call the clinic [phone]), or go to the ER immediately if symptoms are severe.

## 2016-02-05 NOTE — Progress Notes (Signed)
   Subjective:   Patient ID: Francisco Roberts Chinchilla male   DOB: 11-Oct-1921 82 y.o.   MRN: [id number]  HPI: [name] is a 82 y.o. male w/ PMHx of DM type II, CKD stage 3, glaucoma, and h/o GIB, presents to the clinic today for a hospital follow-up visit after admission for GI bleed and aspiration pneumonitis. Patient is accompanied by his grand daughter today whom the patient lives with. He is doing quite well, seems to be at his baseline. In good spirits, able to answer questions, no breathing problems, no pain, no recent hematemesis, melena, or hematochezia. No dizziness, lightheadedness, fever, chills, nausea, vomiting, or diarrhea.   The granddaughter thinks that the patient is back to normal self, has been receiving HHPT and also has an aide at home but feels she needs some extra help. She also takes care of the patient's wife (her grandmother), who is similar age and has similar deficits.   Past Medical History  Diagnosis Date  . Diabetes mellitus without complication (HCC)   . Glaucoma    Current Outpatient Prescriptions  Medication Sig Dispense Refill  . Multiple Vitamin (MULTIVITAMIN WITH MINERALS) TABS tablet Take 1 tablet by mouth daily.    . pantoprazole (PROTONIX) 40 MG tablet Take 1 tablet (40 mg total) by mouth 2 (two) times daily. 60 tablet 0   No current facility-administered medications for this visit.    Review of Systems: General: Denies fever, chills, diaphoresis, appetite change and fatigue.  Respiratory: Denies SOB, DOE, cough, and wheezing.   Cardiovascular: Denies chest pain and palpitations.  Gastrointestinal: Denies nausea, vomiting, abdominal pain, and diarrhea.  Genitourinary: Denies dysuria, increased frequency, and flank pain. Endocrine: Denies hot or cold intolerance, polyuria, and polydipsia. Musculoskeletal: Denies myalgias, back pain, joint swelling, arthralgias and gait problem.  Skin: Denies pallor, rash and wounds.  Neurological: Denies dizziness, seizures,  syncope, weakness, lightheadedness, numbness and headaches.  Psychiatric/Behavioral: Denies mood changes, and sleep disturbances.  Objective:   Physical Exam: Filed Vitals:   02/05/16 1458  BP: 148/82  Pulse: 72  Temp: 97.8 F (36.6 C)  Height: 6\' 1"  (1.854 m)  SpO2: 100%    General: Elderly male, alert, cooperative, NAD. HEENT: PERRL, EOMI. Moist mucus membranes. Adentulate.  Neck: Full range of motion without pain, supple, no lymphadenopathy or carotid bruits Lungs: Clear to ascultation bilaterally, normal work of respiration, no wheezes, rales, rhonchi Heart: RRR, no murmurs, gallops, or rubs Abdomen: Soft, non-tender, non-distended, BS + Extremities: No cyanosis, clubbing, or edema Neurologic: Alert & oriented X3, cranial nerves II-XII intact, strength grossly intact, sensation intact to light touch   Assessment & Plan:   Please see problem based assessment and plan.

## 2016-02-05 NOTE — Telephone Encounter (Signed)
This sounds good. Thank you. °

## 2016-02-05 NOTE — Telephone Encounter (Signed)
HH requesting 1x per week for 2 weeks. To educate patient and family about feeding thickened liquids and aspiration precautions. Are you ok with this plan?

## 2016-02-05 NOTE — Telephone Encounter (Signed)
[facility]

## 2016-02-06 LAB — CBC WITH DIFFERENTIAL/PLATELET
BASOS: 0 %
Basophils Absolute: 0 10*3/uL (ref 0.0–0.2)
EOS (ABSOLUTE): 0.1 10*3/uL (ref 0.0–0.4)
Eos: 2 %
Hematocrit: 32.6 % — ABNORMAL LOW (ref 37.5–51.0)
Hemoglobin: 10.2 g/dL — ABNORMAL LOW (ref 12.6–17.7)
IMMATURE GRANULOCYTES: 0 %
Immature Grans (Abs): 0 10*3/uL (ref 0.0–0.1)
LYMPHS ABS: 1.5 10*3/uL (ref 0.7–3.1)
LYMPHS: 23 %
MCH: 29.1 pg (ref 26.6–33.0)
MCHC: 31.3 g/dL — AB (ref 31.5–35.7)
MCV: 93 fL (ref 79–97)
MONOCYTES: 9 %
Monocytes Absolute: 0.6 10*3/uL (ref 0.1–0.9)
NEUTROS ABS: 4.2 10*3/uL (ref 1.4–7.0)
Neutrophils: 66 %
PLATELETS: 259 10*3/uL (ref 150–379)
RBC: 3.51 x10E6/uL — ABNORMAL LOW (ref 4.14–5.80)
RDW: 15.8 % — AB (ref 12.3–15.4)
WBC: 6.5 10*3/uL (ref 3.4–10.8)

## 2016-02-08 ENCOUNTER — Other Ambulatory Visit: Payer: Self-pay | Admitting: Internal Medicine

## 2016-02-08 DIAGNOSIS — Z7189 Other specified counseling: Secondary | ICD-10-CM

## 2016-02-08 DIAGNOSIS — Z515 Encounter for palliative care: Secondary | ICD-10-CM

## 2016-02-09 ENCOUNTER — Telehealth: Payer: Self-pay | Admitting: Licensed Clinical Social Worker

## 2016-02-09 MED ORDER — DOCUSATE SODIUM 100 MG PO CAPS: 100.0000 mg | ORAL_CAPSULE | Freq: Every day | ORAL | Status: AC | PRN

## 2016-02-09 NOTE — Telephone Encounter (Signed)
CSW received call from pt's primary caregiver, [name] to discuss home health services.  See CSW telephone note 02/09/16.  During this phone call grand-daughter voiced concern that [name] has not had a bowel movement since Friday, 02/05/16 and inquired to whom would she notify.  Family states BM was dark but not "blackish/tar".  CSW informed [name] this note will be forwarded to Triage RN for return call.

## 2016-02-09 NOTE — Telephone Encounter (Signed)
CSW received incoming call from Ms. Randal BubaG Sexton (mobile [phone]), [facility].  Ms. Henderson NewcomerSexton states her agency has been providing personal care services through [employer].  However, agency has had to pull out due to services denied because pt has not been [facility].  [facility] informed that pt can only receive services through their agency while [facility] is [facility] or [facility].  Upon chart review, pt has been [facility] HH PT and [facility] since recent [facility]/c.  Physician added HH RN/OT at hospital f/u appointment. CS[facility], agency notified of additional orders for RN and OT.  Referral faxed to [phone].

## 2016-02-09 NOTE — Assessment & Plan Note (Signed)
Lab Results  Component Value Date   HGBA1C 6.7 12/29/2015     Assessment: Diabetes control:  At goal Comments: Controlled with diet

## 2016-02-09 NOTE — Assessment & Plan Note (Signed)
No further urinary symptoms. No abdominal pain. Able to urinate comfortably at home per grand daughter.

## 2016-02-09 NOTE — Telephone Encounter (Signed)
Spoke with patient's grand-daughter. She concerned because his stool is dark brown and he has not had a bowel movement since 02/05/2016. Denies and bright red bleeding, black stool, or abdominal pain. Has not tried anything over the counter because she wanted to speak with the doctor first. Encouraged her to try warmed prune juice, hot tea, and coffee. Please advise. Thanks!

## 2016-02-09 NOTE — Addendum Note (Signed)
Addended by: Dorie RankPOWERS, Ender Rorke E on: 02/09/2016 12:47 PM   Modules accepted: Orders

## 2016-02-09 NOTE — Assessment & Plan Note (Addendum)
No further bleeding per patient, grand daughter. Repeat Hb appropriate rise, now 10.2.  -RTC in 2 weeks for follow up -[facility] and [facility] for extra help at home for medication management, ADL and IADL assistance.

## 2016-02-09 NOTE — Telephone Encounter (Signed)
Ms. [name] placed call to CSW to inquire about order for additional home health services.  Ms. [name], primary caregiver, notified [facility] have been placed [facility].  Family aware they should be [facility].  Ms. [name] voiced her dissatisfaction with [facility] and how family/worker were notified services were discontinued.  CSW informed family, [facility] could also provide aide services but would need a physician order.  Ms. [name] would like to limit the number of agencies and people coming into the home as it increases confusion with her grand-parents.  CSW discussed the purpose of [facility], working towards a goal and generally not on-going services.  Encouraged the grand-daughter to start planning for ADL assistance on a private pay basis.  Palliative Care was also discussed; but noted how family maintains full code status for patient.  CSW discussed family may not be ready for palliative care but will send additional information.  CSW will send information on [facility] and additional information on Palliative Care.

## 2016-02-09 NOTE — Assessment & Plan Note (Signed)
Lungs clear on exam, no fevers or cough. Per the grand daughter, SLP has seen patient in the house. She is aware of appropriate aspiration precautions. Sits patient up to eat, thickens thin liquids, cuts food into small pieces.

## 2016-02-09 NOTE — Telephone Encounter (Signed)
Sent in Rx for Colace. If he develops melena, hematochezia or abdominal pain have him schedule an appointment. Otherwise, dark brown (not black) stools are normal.

## 2016-02-11 ENCOUNTER — Telehealth: Payer: Self-pay

## 2016-02-11 ENCOUNTER — Other Ambulatory Visit (INDEPENDENT_AMBULATORY_CARE_PROVIDER_SITE_OTHER): Payer: Medicare HMO

## 2016-02-11 DIAGNOSIS — R195 Other fecal abnormalities: Secondary | ICD-10-CM | POA: Diagnosis not present

## 2016-02-11 LAB — POC HEMOCCULT BLD/STL (OFFICE/1-CARD/DIAGNOSTIC): Fecal Occult Blood, POC: POSITIVE — AB

## 2016-02-11 NOTE — Telephone Encounter (Signed)
Patients granddaughter Selena BattenKim presented to clinic this morning with a stool specimen from this morning, concern was she is unsure if color just dark or indeed black/tarry.   Stool with strong odor and positive for occult blood with poc testing.   Granddaughter not reporting any changes in patient condition, not complaining of any abdominal pain and is at baseline mental status.  Only complaint is feeling like he has a chest cold.  She would like to have patient seen tomorrow if possible.  Will schedule. I did reassure her that labs on hospital follow-up visit looked improved from hospitalization.  Anything else you would like me to do?

## 2016-02-11 NOTE — Telephone Encounter (Signed)
That is good  Thank you

## 2016-02-11 NOTE — Progress Notes (Signed)
Internal Medicine Clinic Attending  Case discussed with Dr. Jones at the time of the visit.  We reviewed the resident's history and exam and pertinent patient test results.  I agree with the assessment, diagnosis, and plan of care documented in the resident's note.  

## 2016-02-12 ENCOUNTER — Telehealth: Payer: Self-pay

## 2016-02-12 ENCOUNTER — Encounter: Payer: Self-pay | Admitting: Pulmonary Disease

## 2016-02-12 ENCOUNTER — Ambulatory Visit (INDEPENDENT_AMBULATORY_CARE_PROVIDER_SITE_OTHER): Payer: Medicare HMO | Admitting: Pulmonary Disease

## 2016-02-12 VITALS — BP 134/46 | HR 78

## 2016-02-12 DIAGNOSIS — J069 Acute upper respiratory infection, unspecified: Secondary | ICD-10-CM

## 2016-02-12 DIAGNOSIS — K922 Gastrointestinal hemorrhage, unspecified: Secondary | ICD-10-CM | POA: Diagnosis not present

## 2016-02-12 DIAGNOSIS — Z7189 Other specified counseling: Secondary | ICD-10-CM | POA: Diagnosis not present

## 2016-02-12 LAB — CBC
HEMATOCRIT: 30.8 % — AB (ref 39.0–52.0)
Hemoglobin: 9.7 g/dL — ABNORMAL LOW (ref 13.0–17.0)
MCH: 29 pg (ref 26.0–34.0)
MCHC: 31.5 g/dL (ref 30.0–36.0)
MCV: 92.2 fL (ref 78.0–100.0)
Platelets: 232 10*3/uL (ref 150–400)
RBC: 3.34 MIL/uL — AB (ref 4.22–5.81)
RDW: 16.2 % — ABNORMAL HIGH (ref 11.5–15.5)
WBC: 5 10*3/uL (ref 4.0–10.5)

## 2016-02-12 NOTE — Progress Notes (Signed)
   Subjective:    Patient ID: Francisco Roberts, male    DOB: 03-21-1922, [age] y.o.   MRN: [id number]  HPI [name] is a 82 year old man with history of DM type II, CKD stage 3, glaucoma, and h/o GIB, presenting for follow up of GI bleed.  Was constipated. When he finally had a bowel movement granddaughter noticed that it was dark. Stool occult +.   "Rattling" in chest starting Wednesday night. Cough productive of yellow-green sputum. Noticed wheezing last night. No inhalers at home. No sick contacts. Denies coughing or choking with food. Has been using aspiration precautions. +rhinorrhea - yellow green.   Review of Systems Constitutional: no fevers/chills Ears, nose, mouth, throat, and face: +cough Respiratory: no shortness of breath Cardiovascular: no chest pain Gastrointestinal: no nausea/vomiting, no abdominal pain  Past Medical History  Diagnosis Date  . Diabetes mellitus without complication (HCC)   . Glaucoma     Current Outpatient Prescriptions on File Prior to Visit  Medication Sig Dispense Refill  . docusate sodium (COLACE) 100 MG capsule Take 1 capsule (100 mg total) by mouth daily as needed. 30 capsule 1  . Multiple Vitamin (MULTIVITAMIN WITH MINERALS) TABS tablet Take 1 tablet by mouth daily.    . pantoprazole (PROTONIX) 40 MG tablet Take 1 tablet (40 mg total) by mouth 2 (two) times daily. 60 tablet 0   No current facility-administered medications on file prior to visit.   Today's Vitals   02/12/16 1551  BP: 134/46  Pulse: 78  SpO2: 100%      Objective:   Physical Exam General Apperance: NAD HEENT: Normocephalic, atraumatic, anicteric sclera Neck: Supple, trachea midline Lungs: Bronchial breath sounds throughout Heart: Regular rate and rhythm, no murmur/rub/gallop Abdomen: Soft, nontender, nondistended, no rebound/guarding Extremities: Warm and well perfused, no edema Skin: No rashes or lesions Neurologic: Alert and interactive. No gross deficits.      Assessment & Plan:  Please refer to problem based charting.

## 2016-02-12 NOTE — Telephone Encounter (Signed)
Spoke with Windell Mouldinguth, VO for OT 2x/week x 1 week for ADL eval for equipment   Is this OK?

## 2016-02-12 NOTE — Patient Instructions (Signed)
If your symptoms do not improve after 7-10 days, please come back to our clinic Follow up in 4 weeks otherwise

## 2016-02-12 NOTE — Telephone Encounter (Signed)
Spoke w/ ami HHN, she ask for verbal approval for: Skilled nsg visits for medication and disease management Wound care assess and treat PCS services PT and OT assess and treat HH CSW for community services available Do you agree with verbal approval?

## 2016-02-12 NOTE — Telephone Encounter (Signed)
Windell MouldingRuth [facility] requesting the nurse to call back regarding plan of care for pt.

## 2016-02-12 NOTE — Telephone Encounter (Signed)
OK with me Dr Cyndie ChimeGranfortuna for [facility]

## 2016-02-12 NOTE — Telephone Encounter (Signed)
[facility] speak with a nurse regarding pt. Please call back.

## 2016-02-12 NOTE — Telephone Encounter (Signed)
LVM for Amy

## 2016-02-14 DIAGNOSIS — J209 Acute bronchitis, unspecified: Secondary | ICD-10-CM | POA: Insufficient documentation

## 2016-02-14 NOTE — Assessment & Plan Note (Signed)
Had a lengthy discussion with granddaughter. She is well aware of the reasoning behind Full Code versus DNR. She at one point had the patient DNR, but felt that health care members interpret this as comfort care. She overheard a nurse criticizing her for keeping him a full code. She is doing this to protect him from biases that come with DNR - so that he gets the same care as someone with Full Code. She is willing to readdress this if his health status declines to the point where death is imminent.

## 2016-02-14 NOTE — Assessment & Plan Note (Addendum)
FOBT+ and Hgb today 9.7 from 10.2. Reconfirmed goals of care for GI bleed. Granddaughter would like to continue conservative treatment.  PPI daily Follow CBC. Follow up in clinic in 3-4 weeks Blood transfusion prn

## 2016-02-14 NOTE — Assessment & Plan Note (Signed)
Yellow green drainage from nose and coughing up yellow green sputum. Bronchial breath sounds on exam. She has been following aspiration precautions. Denies fevers at home.   Supportive care for now If his cough and symptoms do not improve in 7 to 10 days from Wednesday, would prescribe a course of Augmentin.

## 2016-02-16 ENCOUNTER — Encounter: Payer: Self-pay | Admitting: Student in an Organized Health Care Education/Training Program

## 2016-02-16 NOTE — Progress Notes (Signed)
Internal Medicine Clinic Attending  Case discussed with Dr. Krall at the time of the visit.  We reviewed the resident's history and exam and pertinent patient test results.  I agree with the assessment, diagnosis, and plan of care documented in the resident's note.  

## 2016-02-17 ENCOUNTER — Ambulatory Visit (INDEPENDENT_AMBULATORY_CARE_PROVIDER_SITE_OTHER): Payer: Medicare HMO | Admitting: Podiatry

## 2016-02-17 ENCOUNTER — Encounter: Payer: Self-pay | Admitting: Podiatry

## 2016-02-17 VITALS — BP 116/78 | HR 69 | Resp 12

## 2016-02-17 DIAGNOSIS — M79675 Pain in left toe(s): Secondary | ICD-10-CM | POA: Diagnosis not present

## 2016-02-17 DIAGNOSIS — M79674 Pain in right toe(s): Secondary | ICD-10-CM

## 2016-02-17 DIAGNOSIS — B351 Tinea unguium: Secondary | ICD-10-CM

## 2016-02-17 NOTE — Telephone Encounter (Signed)
This is fine 

## 2016-02-17 NOTE — Patient Instructions (Signed)
Diabetes and Foot Care Diabetes may cause you to have problems because of poor blood supply (circulation) to your feet and legs. This may cause the skin on your feet to become thinner, break easier, and heal more slowly. Your skin may become dry, and the skin may peel and crack. You may also have nerve damage in your legs and feet causing decreased feeling in them. You may not notice minor injuries to your feet that could lead to infections or more serious problems. Taking care of your feet is one of the most important things you can do for yourself.  HOME CARE INSTRUCTIONS  Wear shoes at all times, even in the house. Do not go barefoot. Bare feet are easily injured.  Check your feet daily for blisters, cuts, and redness. If you cannot see the bottom of your feet, use a mirror or ask someone for help.  Wash your feet with warm water (do not use hot water) and mild soap. Then pat your feet and the areas between your toes until they are completely dry. Do not soak your feet as this can dry your skin.  Apply a moisturizing lotion or petroleum jelly (that does not contain alcohol and is unscented) to the skin on your feet and to dry, brittle toenails. Do not apply lotion between your toes.  Trim your toenails straight across. Do not dig under them or around the cuticle. File the edges of your nails with an emery board or nail file.  Do not cut corns or calluses or try to remove them with medicine.  Wear clean socks or stockings every day. Make sure they are not too tight. Do not wear knee-high stockings since they may decrease blood flow to your legs.  Wear shoes that fit properly and have enough cushioning. To break in new shoes, wear them for just a few hours a day. This prevents you from injuring your feet. Always look in your shoes before you put them on to be sure there are no objects inside.  Do not cross your legs. This may decrease the blood flow to your feet.  If you find a minor scrape,  cut, or break in the skin on your feet, keep it and the skin around it clean and dry. These areas may be cleansed with mild soap and water. Do not cleanse the area with peroxide, alcohol, or iodine.  When you remove an adhesive bandage, be sure not to damage the skin around it.  If you have a wound, look at it several times a day to make sure it is healing.  Do not use heating pads or hot water bottles. They may burn your skin. If you have lost feeling in your feet or legs, you may not know it is happening until it is too late.  Make sure your health care provider performs a complete foot exam at least annually or more often if you have foot problems. Report any cuts, sores, or bruises to your health care provider immediately. SEEK MEDICAL CARE IF:   You have an injury that is not healing.  You have cuts or breaks in the skin.  You have an ingrown nail.  You notice redness on your legs or feet.  You feel burning or tingling in your legs or feet.  You have pain or cramps in your legs and feet.  Your legs or feet are numb.  Your feet always feel cold. SEEK IMMEDIATE MEDICAL CARE IF:   There is increasing redness,   swelling, or pain in or around a wound.  There is a red line that goes up your leg.  Pus is coming from a wound.  You develop a fever or as directed by your health care provider.  You notice a bad smell coming from an ulcer or wound.   This information is not intended to replace advice given to you by your health care provider. Make sure you discuss any questions you have with your health care provider.   Document Released: 08/26/2000 Document Revised: 05/01/2013 Document Reviewed: 02/05/2013 Elsevier Interactive Patient Education 2016 Elsevier Inc.  

## 2016-02-17 NOTE — Progress Notes (Signed)
   Subjective:    Patient ID: Francisco Roberts, male    DOB: 1922/03/06, 82 y.o.   MRN: [id number]  HPI    This patient presents today with his granddaughter in the treatment room who is speaking for the patient. She is requesting trimming of his thickened elongated toenails which are comfortable with shoe wearing an occasional walking. Patient's granddaughters attempted trim the nails, however, is not able to do so. She also states that her grandfather has a callus on the left foot which is uncomfortable. She says that her grandfather was a patient of [place] podiat[place] and has not had any podiatric care recently. She says he occasionally walks and stands for short periods of time. Patient's granddaughter is also requesting diabetic shoes  She says he is a diabetic in the past is been on insulin, however, is not on any medicine currently she denies any known history of foot ulceration or claudication or amputation  Review of Systems  Musculoskeletal: Positive for gait problem.       Objective:   Physical Exam  Patient appears hard of hearing and has difficulty communicating and responding to direct questioning Patient transferred from wheelchair to treatment chair  Vascular: DP pulse 1/4 bilaterally PT pulses 1/4 bilaterally Capillary reflex delayed bilaterally  Neurological: Patient has difficulty responding to 10 g monofilament wire responded 0 out of 5 (difficult to evaluate) Patient has difficulty responding to tuning fork and does not respond Ankle reflexes weakly reactive bilaterally  Dermatological: Dry eschars dorsal first, third, fifth toes Area of erythema lateral base fifth left metatarsal Plantar callus fifth MPJ Toenails are brittle, deformed, elongated and patient reacts to direct palpation of the nail plates  Musculoskeletal: Hammertoes 1-5 bilaterally Rigid foot type with feet in a varus position Limited range of motion ankle,  subtalar, midtarsal joints bilaterally      Assessment & Plan:   Assessment: Decrease pedal pulses bilaterally Difficult to evaluate protective sensation because of patient's difficulty of responding Hammertoe deformities 1-5 bilaterally Small plantar callus fifth MPJ left  Plan: Today review the results of the exam with patient's Francisco Roberts Francisco Roberts and patient Debrided toenails 6-10 mechanically and electrically without any bleeding Debrided plantar callus 1 without any bleeding Obtain certification for diabetic shoes Reappoint 3 months for skin a nail debridement

## 2016-02-18 ENCOUNTER — Telehealth: Payer: Self-pay | Admitting: *Deleted

## 2016-02-18 NOTE — Telephone Encounter (Signed)
HHN calls and states that pt has "a lot of rhonchi in his bases, he cannot cough when ask to but when he does thick green mucous comes up" she ask about a chest xray also she wonders about incentive spirometry but states he doesn't do well with actions on command. She states family is worried, appt given for 6/9 at 1545

## 2016-02-18 NOTE — Telephone Encounter (Signed)
Sounds good. Can we schedule a close follow up with me as well after this visit.

## 2016-02-19 ENCOUNTER — Encounter: Payer: Self-pay | Admitting: Pulmonary Disease

## 2016-02-19 ENCOUNTER — Ambulatory Visit (INDEPENDENT_AMBULATORY_CARE_PROVIDER_SITE_OTHER): Payer: Medicare HMO | Admitting: Pulmonary Disease

## 2016-02-19 ENCOUNTER — Ambulatory Visit: Payer: Medicare HMO | Admitting: Internal Medicine

## 2016-02-19 VITALS — BP 161/68 | HR 71 | Temp 97.4°F | Resp 18

## 2016-02-19 DIAGNOSIS — J209 Acute bronchitis, unspecified: Secondary | ICD-10-CM

## 2016-02-19 DIAGNOSIS — R05 Cough: Secondary | ICD-10-CM

## 2016-02-19 MED ORDER — AZITHROMYCIN 500 MG PO TABS: 500.0000 mg | ORAL_TABLET | Freq: Every day | ORAL | Status: AC

## 2016-02-19 MED ORDER — PANTOPRAZOLE SODIUM 40 MG PO TBEC: 40.0000 mg | DELAYED_RELEASE_TABLET | Freq: Every day | ORAL | Status: AC

## 2016-02-19 NOTE — Progress Notes (Signed)
   Subjective:    Patient ID: Francisco Roberts, male    DOB: [birth year], 82 y.o.   MRN: [id number]  Cough   Francisco Roberts is a 82 year old man with history of DM type II, CKD stage 3, glaucoma, and h/o GIB, presenting for follow up of cough.  Was seen in clinic on 6/4 for "Rattling" in chest starting Wednesday night. Cough productive of yellow-green sputum. Still having coughing. +rhinorrhea but now clear. Home health RN was concerned.    Review of Systems  Respiratory: Positive for cough.    Constitutional: no fevers/chills Ears, nose, mouth, throat, and face: +cough Respiratory: no shortness of breath Cardiovascular: no chest pain Gastrointestinal: no nausea/vomiting, no abdominal pain  Past Medical History  Diagnosis Date  . Diabetes mellitus without complication (HCC)   . Glaucoma     Current Outpatient Prescriptions on File Prior to Visit  Medication Sig Dispense Refill  . acyclovir (ZOVIRAX) 800 MG tablet TK 1 T PO QID  0  . docusate sodium (COLACE) 100 MG capsule Take 1 capsule (100 mg total) by mouth daily as needed. 30 capsule 1  . Multiple Vitamin (MULTIVITAMIN WITH MINERALS) TABS tablet Take 1 tablet by mouth daily.    . pantoprazole (PROTONIX) 40 MG tablet Take 1 tablet (40 mg total) by mouth 2 (two) times daily. 60 tablet 0   No current facility-administered medications on file prior to visit.      Objective:   Physical Exam Blood pressure 161/68, pulse 71, temperature 97.4 F (36.3 C), temperature source Oral, resp. rate 18, SpO2 100 %. General Apperance: NAD HEENT: Normocephalic, atraumatic, anicteric sclera Neck: Supple, trachea midline Lungs: Bronchial breath sounds throughout Heart: Regular rate and rhythm, no murmur/rub/gallop Abdomen: Soft, nontender, nondistended, no rebound/guarding Extremities: Warm and well perfused, no edema Skin: No rashes or lesions Neurologic: Alert and interactive. No gross deficits.    Assessment & Plan:  Please refer to problem  based charting.

## 2016-02-19 NOTE — Patient Instructions (Signed)
Take the antibiotic 1 tablet daily for 3 days. Follow up as previously instructed.

## 2016-02-21 NOTE — Assessment & Plan Note (Addendum)
Assessment: Probable acute bronchitis. No signs or symptoms that would be concerning for pneumonia (fevers, systemic signs of infection, etc)  Plan: Azithromycin 500mg  daily for three days. Would expect cough to continue as he recovers from bronchitis. If he does develop worsening signs of infection, consider obtaining further imaging and broaden abx to cover aspiration

## 2016-02-22 NOTE — Progress Notes (Signed)
Internal Medicine Clinic Attending  Case discussed with Dr. Krall at the time of the visit.  We reviewed the resident's history and exam and pertinent patient test results.  I agree with the assessment, diagnosis, and plan of care documented in the resident's note.  

## 2016-02-22 NOTE — Addendum Note (Signed)
Addended by: Erlinda HongVINCENT, DUNCAN T on: 02/22/2016 03:07 PM   Modules accepted: Level of Service

## 2016-03-23 ENCOUNTER — Encounter: Payer: Self-pay | Admitting: Internal Medicine

## 2016-03-23 ENCOUNTER — Encounter: Payer: Medicare HMO | Admitting: Internal Medicine

## 2016-04-04 ENCOUNTER — Ambulatory Visit: Payer: Medicare HMO

## 2016-04-04 ENCOUNTER — Encounter: Payer: Self-pay | Admitting: Internal Medicine

## 2016-04-11 ENCOUNTER — Ambulatory Visit (INDEPENDENT_AMBULATORY_CARE_PROVIDER_SITE_OTHER): Payer: Medicare HMO | Admitting: Internal Medicine

## 2016-04-11 ENCOUNTER — Encounter (INDEPENDENT_AMBULATORY_CARE_PROVIDER_SITE_OTHER): Payer: Self-pay

## 2016-04-11 VITALS — BP 119/59 | HR 70

## 2016-04-11 DIAGNOSIS — Z09 Encounter for follow-up examination after completed treatment for conditions other than malignant neoplasm: Secondary | ICD-10-CM | POA: Diagnosis not present

## 2016-04-11 DIAGNOSIS — Z515 Encounter for palliative care: Secondary | ICD-10-CM

## 2016-04-11 DIAGNOSIS — Z8719 Personal history of other diseases of the digestive system: Secondary | ICD-10-CM | POA: Diagnosis not present

## 2016-04-11 DIAGNOSIS — Z7189 Other specified counseling: Secondary | ICD-10-CM | POA: Diagnosis not present

## 2016-04-11 DIAGNOSIS — K922 Gastrointestinal hemorrhage, unspecified: Secondary | ICD-10-CM

## 2016-04-11 NOTE — Patient Instructions (Signed)
Thank you for coming to see me today. It was a pleasure. Today we talked about:   GI Bleed: - I am checking some labs today to make sure his hemoglobin is stable but I am reassured that there is no bleeding currently.  I will let you know if anything is abnormal.  Palliative Care: - I have placed a referral today for you.  Please follow-up with [place] in about 1-2 months to repeat blood work.  If you have any questions or concerns, please do not hesitate to call the office at [phone].  Take Care,   Gwynn Burly, DO

## 2016-04-11 NOTE — Progress Notes (Signed)
   CC: here for follow up of GI bleed and palliative care discussion  HPI:  [name] is a 82 y.o. man with PMHx as below who is here today for follow up of his GI bleed and palliative care discussion.  He is accompanied by his grand daughter.  Please see A&P for status of medical conditions addressed today.  Past Medical History:  Diagnosis Date  . Diabetes mellitus without complication (HCC)   . Glaucoma     Review of Systems:   Review of Systems  Constitutional: Negative.   Gastrointestinal: Negative for blood in stool and melena.  Genitourinary: Negative for hematuria.     Physical Exam:  Vitals:   04/11/16 1425  BP: (!) 119/59  Pulse: 70   Physical Exam  Constitutional:  Pleasant, elderly man, sitting in wheelchair, not distressed.  HENT:  Head: Normocephalic and atraumatic.  Eyes: EOM are normal.  Pulmonary/Chest: Effort normal.    Assessment & Plan:   See Encounters Tab for problem based charting.   Patient discussed with Dr. Oswaldo Roberts   GI bleed A: Patient and grand daughter have not noticed any further bleeding.  She states he is here today for routine follow up to make sure his hemoglobin is stable.   P: - hemoglobin this visit improved at 11.3 - continue conservative treatment - PPI daily - blood transfusion prn - f/u 1-2 months.  Advanced care planning A: Grand daughter and family have continued to think about goals of care and further advanced care planning in light of patient's GI bleed, CKD with DM, and advanced age.  She is asking today for a ambulatory referral to palliative care to assist with this.  P: - referral to palliative care placed.

## 2016-04-12 LAB — CBC
HEMATOCRIT: 35.7 % — AB (ref 37.5–51.0)
Hemoglobin: 11.3 g/dL — ABNORMAL LOW (ref 12.6–17.7)
MCH: 29 pg (ref 26.6–33.0)
MCHC: 31.7 g/dL (ref 31.5–35.7)
MCV: 92 fL (ref 79–97)
Platelets: 205 10*3/uL (ref 150–379)
RBC: 3.9 x10E6/uL — AB (ref 4.14–5.80)
RDW: 15.6 % — AB (ref 12.3–15.4)
WBC: 5.6 10*3/uL (ref 3.4–10.8)

## 2016-04-12 NOTE — Assessment & Plan Note (Signed)
A: Patient and grand daughter have not noticed any further bleeding.  She states he is here today for routine follow up to make sure his hemoglobin is stable.   P: - hemoglobin this visit improved at 11.3 - continue conservative treatment - PPI daily - blood transfusion prn - f/u 1-2 months.

## 2016-04-12 NOTE — Assessment & Plan Note (Signed)
A: Grand daughter and family have continued to think about goals of care and further advanced care planning in light of patient's GI bleed, CKD with DM, and advanced age.  She is asking today for a ambulatory referral to palliative care to assist with this.  P: - referral to palliative care placed.

## 2016-04-13 NOTE — Progress Notes (Signed)
Internal Medicine Clinic Attending  I saw and evaluated the patient.  I personally confirmed the key portions of the history and exam documented by Dr. Wallace and I reviewed pertinent patient test results.  The assessment, diagnosis, and plan were formulated together and I agree with the documentation in the resident's note. 

## 2016-04-14 ENCOUNTER — Telehealth: Payer: Self-pay | Admitting: *Deleted

## 2016-04-14 NOTE — Telephone Encounter (Signed)
Lab results given to pt's grand-daughter, phone call complete.Francisco Roberts, Francisco Cassady8/3/20179:50 AM

## 2016-04-14 NOTE — Telephone Encounter (Signed)
-----   Message from Gwynn Burly, DO sent at 04/12/2016  6:30 PM EDT ----- Marca Ancona, Could you please call Mr. Partain's grand daughter and let her know his hemoglobin is stable (acutally better) than it was a couple months ago?  Thanks, Greig Castilla

## 2016-04-20 ENCOUNTER — Ambulatory Visit: Payer: Medicare HMO | Admitting: Audiology

## 2016-05-25 ENCOUNTER — Ambulatory Visit (INDEPENDENT_AMBULATORY_CARE_PROVIDER_SITE_OTHER): Payer: Medicare HMO | Admitting: Podiatry

## 2016-05-26 NOTE — Progress Notes (Signed)
NO SHOW-ERRONEOUS ENCOUNTER 

## 2016-05-30 ENCOUNTER — Ambulatory Visit: Payer: Medicare HMO | Attending: Internal Medicine | Admitting: Audiology

## 2016-05-31 NOTE — Addendum Note (Signed)
Addended by: Neomia DearPOWERS, Estelene Carmack E on: 05/31/2016 07:43 PM   Modules accepted: Orders

## 2016-06-30 ENCOUNTER — Inpatient Hospital Stay (HOSPITAL_COMMUNITY): Payer: Medicare HMO

## 2016-06-30 ENCOUNTER — Encounter (HOSPITAL_COMMUNITY): Payer: Self-pay | Admitting: Radiology

## 2016-06-30 ENCOUNTER — Inpatient Hospital Stay (HOSPITAL_COMMUNITY)
Admission: EM | Admit: 2016-06-30 | Discharge: 2016-07-04 | DRG: 208 | Disposition: A | Discharge status: Home Health | Payer: Medicare HMO | Attending: Internal Medicine | Admitting: Internal Medicine

## 2016-06-30 ENCOUNTER — Observation Stay (HOSPITAL_COMMUNITY): Payer: Medicare HMO

## 2016-06-30 ENCOUNTER — Emergency Department (HOSPITAL_COMMUNITY): Payer: Medicare HMO

## 2016-06-30 DIAGNOSIS — R5383 Other fatigue: Secondary | ICD-10-CM | POA: Diagnosis present

## 2016-06-30 DIAGNOSIS — Z66 Do not resuscitate: Secondary | ICD-10-CM | POA: Diagnosis present

## 2016-06-30 DIAGNOSIS — N390 Urinary tract infection, site not specified: Secondary | ICD-10-CM | POA: Diagnosis present

## 2016-06-30 DIAGNOSIS — R1310 Dysphagia, unspecified: Secondary | ICD-10-CM | POA: Diagnosis present

## 2016-06-30 DIAGNOSIS — L8990 Pressure ulcer of unspecified site, unspecified stage: Secondary | ICD-10-CM | POA: Insufficient documentation

## 2016-06-30 DIAGNOSIS — R531 Weakness: Secondary | ICD-10-CM

## 2016-06-30 DIAGNOSIS — L89890 Pressure ulcer of other site, unstageable: Secondary | ICD-10-CM | POA: Diagnosis present

## 2016-06-30 DIAGNOSIS — E1122 Type 2 diabetes mellitus with diabetic chronic kidney disease: Secondary | ICD-10-CM | POA: Diagnosis present

## 2016-06-30 DIAGNOSIS — J9601 Acute respiratory failure with hypoxia: Secondary | ICD-10-CM | POA: Diagnosis present

## 2016-06-30 DIAGNOSIS — D649 Anemia, unspecified: Secondary | ICD-10-CM | POA: Diagnosis present

## 2016-06-30 DIAGNOSIS — B957 Other staphylococcus as the cause of diseases classified elsewhere: Secondary | ICD-10-CM | POA: Diagnosis present

## 2016-06-30 DIAGNOSIS — E875 Hyperkalemia: Secondary | ICD-10-CM | POA: Diagnosis present

## 2016-06-30 DIAGNOSIS — N179 Acute kidney failure, unspecified: Secondary | ICD-10-CM | POA: Diagnosis present

## 2016-06-30 DIAGNOSIS — H409 Unspecified glaucoma: Secondary | ICD-10-CM | POA: Diagnosis present

## 2016-06-30 DIAGNOSIS — I4510 Unspecified right bundle-branch block: Secondary | ICD-10-CM | POA: Diagnosis present

## 2016-06-30 DIAGNOSIS — Z1639 Resistance to other specified antimicrobial drug: Secondary | ICD-10-CM | POA: Diagnosis present

## 2016-06-30 DIAGNOSIS — Z87891 Personal history of nicotine dependence: Secondary | ICD-10-CM | POA: Diagnosis not present

## 2016-06-30 DIAGNOSIS — Z8719 Personal history of other diseases of the digestive system: Secondary | ICD-10-CM

## 2016-06-30 DIAGNOSIS — R918 Other nonspecific abnormal finding of lung field: Secondary | ICD-10-CM | POA: Diagnosis not present

## 2016-06-30 DIAGNOSIS — N183 Chronic kidney disease, stage 3 (moderate): Secondary | ICD-10-CM | POA: Diagnosis present

## 2016-06-30 DIAGNOSIS — J690 Pneumonitis due to inhalation of food and vomit: Secondary | ICD-10-CM | POA: Diagnosis present

## 2016-06-30 DIAGNOSIS — H905 Unspecified sensorineural hearing loss: Secondary | ICD-10-CM | POA: Diagnosis present

## 2016-06-30 DIAGNOSIS — E872 Acidosis: Secondary | ICD-10-CM | POA: Diagnosis present

## 2016-06-30 LAB — I-STAT CG4 LACTIC ACID, ED
LACTIC ACID, VENOUS: 2.84 mmol/L — AB (ref 0.5–1.9)
LACTIC ACID, VENOUS: 1.55 mmol/L (ref 0.5–1.9)

## 2016-06-30 LAB — CBC WITH DIFFERENTIAL/PLATELET
BASOS ABS: 0 10*3/uL (ref 0.0–0.1)
Basophils Absolute: 0 10*3/uL (ref 0.0–0.1)
Basophils Relative: 0 %
Basophils Relative: 0 %
Eosinophils Absolute: 0 10*3/uL (ref 0.0–0.7)
Eosinophils Absolute: 0 10*3/uL (ref 0.0–0.7)
Eosinophils Relative: 1 %
Eosinophils Relative: 0 %
HEMATOCRIT: 37.1 % — AB (ref 39.0–52.0)
HEMATOCRIT: 39.8 % (ref 39.0–52.0)
Hemoglobin: 12.3 g/dL — ABNORMAL LOW (ref 13.0–17.0)
Hemoglobin: 13.1 g/dL (ref 13.0–17.0)
LYMPHS ABS: 1.3 10*3/uL (ref 0.7–4.0)
LYMPHS PCT: 13 %
Lymphocytes Relative: 16 %
Lymphs Abs: 0.6 10*3/uL — ABNORMAL LOW (ref 0.7–4.0)
MCH: 28.8 pg (ref 26.0–34.0)
MCH: 28.9 pg (ref 26.0–34.0)
MCHC: 33.2 g/dL (ref 30.0–36.0)
MCHC: 32.9 g/dL (ref 30.0–36.0)
MCV: 86.9 fL (ref 78.0–100.0)
MCV: 87.7 fL (ref 78.0–100.0)
MONO ABS: 0.3 10*3/uL (ref 0.1–1.0)
MONO ABS: 0.5 10*3/uL (ref 0.1–1.0)
MONOS PCT: 7 %
MONOS PCT: 6 %
NEUTROS ABS: 3.7 10*3/uL (ref 1.7–7.7)
Neutro Abs: 6.4 10*3/uL (ref 1.7–7.7)
Neutrophils Relative %: 79 %
Neutrophils Relative %: 78 %
PLATELETS: 207 10*3/uL (ref 150–400)
Platelets: 194 10*3/uL (ref 150–400)
RBC: 4.27 MIL/uL (ref 4.22–5.81)
RBC: 4.54 MIL/uL (ref 4.22–5.81)
RDW: 17 % — AB (ref 11.5–15.5)
RDW: 16.9 % — AB (ref 11.5–15.5)
WBC Morphology: INCREASED
WBC: 4.6 10*3/uL (ref 4.0–10.5)
WBC: 8.2 10*3/uL (ref 4.0–10.5)

## 2016-06-30 LAB — COMPREHENSIVE METABOLIC PANEL
ALT: 27 U/L (ref 17–63)
ANION GAP: 9 (ref 5–15)
AST: 40 U/L (ref 15–41)
Albumin: 3.1 g/dL — ABNORMAL LOW (ref 3.5–5.0)
Alkaline Phosphatase: 107 U/L (ref 38–126)
BILIRUBIN TOTAL: 1.4 mg/dL — AB (ref 0.3–1.2)
BUN: 42 mg/dL — AB (ref 6–20)
CO2: 21 mmol/L — ABNORMAL LOW (ref 22–32)
Calcium: 8.4 mg/dL — ABNORMAL LOW (ref 8.9–10.3)
Chloride: 111 mmol/L (ref 101–111)
Creatinine, Ser: 1.66 mg/dL — ABNORMAL HIGH (ref 0.61–1.24)
GFR, EST AFRICAN AMERICAN: 40 mL/min — AB (ref >60)
GFR, EST NON AFRICAN AMERICAN: 34 mL/min — AB (ref >60)
Glucose, Bld: 183 mg/dL — ABNORMAL HIGH (ref 65–99)
POTASSIUM: 5.5 mmol/L — AB (ref 3.5–5.1)
Sodium: 141 mmol/L (ref 135–145)
TOTAL PROTEIN: 6.1 g/dL — AB (ref 6.5–8.1)

## 2016-06-30 LAB — I-STAT ARTERIAL BLOOD GAS, ED
ACID-BASE DEFICIT: 3 mmol/L — AB (ref 0.0–2.0)
Acid-base deficit: 6 mmol/L — ABNORMAL HIGH (ref 0.0–2.0)
BICARBONATE: 22.9 mmol/L (ref 20.0–28.0)
Bicarbonate: 24.8 mmol/L (ref 20.0–28.0)
O2 SAT: 91 %
O2 Saturation: 100 %
Patient temperature: 98.6
TCO2: 27 mmol/L (ref 0–100)
TCO2: 24 mmol/L (ref 0–100)
pCO2 arterial: 71.6 mmHg (ref 32.0–48.0)
pCO2 arterial: 44.5 mmHg (ref 32.0–48.0)
pH, Arterial: 7.148 — CL (ref 7.350–7.450)
pH, Arterial: 7.319 — ABNORMAL LOW (ref 7.350–7.450)
pO2, Arterial: 393 mmHg — ABNORMAL HIGH (ref 83.0–108.0)
pO2, Arterial: 66 mmHg — ABNORMAL LOW (ref 83.0–108.0)

## 2016-06-30 LAB — COMPREHENSIVE METABOLIC PANEL WITH GFR
ALT: 18 U/L (ref 17–63)
AST: 21 U/L (ref 15–41)
Albumin: 3.2 g/dL — ABNORMAL LOW (ref 3.5–5.0)
Alkaline Phosphatase: 103 U/L (ref 38–126)
Anion gap: 9 (ref 5–15)
BUN: 45 mg/dL — ABNORMAL HIGH (ref 6–20)
CO2: 22 mmol/L (ref 22–32)
Calcium: 8.5 mg/dL — ABNORMAL LOW (ref 8.9–10.3)
Chloride: 110 mmol/L (ref 101–111)
Creatinine, Ser: 1.74 mg/dL — ABNORMAL HIGH (ref 0.61–1.24)
GFR calc Af Amer: 38 mL/min — ABNORMAL LOW (ref >60)
GFR calc non Af Amer: 33 mL/min — ABNORMAL LOW (ref >60)
Glucose, Bld: 188 mg/dL — ABNORMAL HIGH (ref 65–99)
Potassium: 4.6 mmol/L (ref 3.5–5.1)
Sodium: 141 mmol/L (ref 135–145)
Total Bilirubin: 0.3 mg/dL (ref 0.3–1.2)
Total Protein: 6.4 g/dL — ABNORMAL LOW (ref 6.5–8.1)

## 2016-06-30 LAB — URINALYSIS, ROUTINE W REFLEX MICROSCOPIC
BILIRUBIN URINE: NEGATIVE
Glucose, UA: NEGATIVE mg/dL
Ketones, ur: NEGATIVE mg/dL
NITRITE: NEGATIVE
PH: 5 (ref 5.0–8.0)
Protein, ur: 30 mg/dL — AB
SPECIFIC GRAVITY, URINE: 1.026 (ref 1.005–1.030)

## 2016-06-30 LAB — TROPONIN I: TROPONIN I: 0.03 ng/mL — AB (ref <0.03)

## 2016-06-30 LAB — I-STAT CHEM 8, ED
BUN: 62 mg/dL — ABNORMAL HIGH (ref 6–20)
CALCIUM ION: 1.1 mmol/L — AB (ref 1.15–1.40)
Chloride: 110 mmol/L (ref 101–111)
Creatinine, Ser: 1.6 mg/dL — ABNORMAL HIGH (ref 0.61–1.24)
GLUCOSE: 184 mg/dL — AB (ref 65–99)
HCT: 38 % — ABNORMAL LOW (ref 39.0–52.0)
HEMOGLOBIN: 12.9 g/dL — AB (ref 13.0–17.0)
POTASSIUM: 5.5 mmol/L — AB (ref 3.5–5.1)
Sodium: 143 mmol/L (ref 135–145)
TCO2: 26 mmol/L (ref 0–100)

## 2016-06-30 LAB — BASIC METABOLIC PANEL
ANION GAP: 10 (ref 5–15)
BUN: 45 mg/dL — ABNORMAL HIGH (ref 6–20)
CALCIUM: 8.1 mg/dL — AB (ref 8.9–10.3)
CO2: 21 mmol/L — AB (ref 22–32)
Chloride: 113 mmol/L — ABNORMAL HIGH (ref 101–111)
Creatinine, Ser: 1.76 mg/dL — ABNORMAL HIGH (ref 0.61–1.24)
GFR, EST AFRICAN AMERICAN: 37 mL/min — AB (ref >60)
GFR, EST NON AFRICAN AMERICAN: 32 mL/min — AB (ref >60)
Glucose, Bld: 184 mg/dL — ABNORMAL HIGH (ref 65–99)
Potassium: 4.3 mmol/L (ref 3.5–5.1)
Sodium: 144 mmol/L (ref 135–145)

## 2016-06-30 LAB — URINE MICROSCOPIC-ADD ON

## 2016-06-30 LAB — PROCALCITONIN: Procalcitonin: 0.93 ng/mL

## 2016-06-30 LAB — TYPE AND SCREEN
ABO/RH(D): O POS
ANTIBODY SCREEN: NEGATIVE

## 2016-06-30 LAB — I-STAT TROPONIN, ED: Troponin i, poc: 0.02 ng/mL (ref 0.00–0.08)

## 2016-06-30 LAB — PROTIME-INR
INR: 1.05
Prothrombin Time: 13.8 seconds (ref 11.4–15.2)

## 2016-06-30 LAB — ABO/RH: ABO/RH(D): O POS

## 2016-06-30 LAB — POC OCCULT BLOOD, ED: FECAL OCCULT BLD: NEGATIVE

## 2016-06-30 LAB — AMMONIA: Ammonia: 20 umol/L (ref 9–35)

## 2016-06-30 LAB — LACTIC ACID, PLASMA: LACTIC ACID, VENOUS: 1.4 mmol/L (ref 0.5–1.9)

## 2016-06-30 LAB — HEMOGLOBIN AND HEMATOCRIT, BLOOD
HEMATOCRIT: 36.6 % — AB (ref 39.0–52.0)
Hemoglobin: 11.7 g/dL — ABNORMAL LOW (ref 13.0–17.0)

## 2016-06-30 MED ORDER — PANTOPRAZOLE SODIUM 40 MG IV SOLR
8.0000 mg/h | INTRAVENOUS | Status: DC
  Administered 2016-06-30 – 2016-07-01 (×2): 8 mg/h via INTRAVENOUS
  Filled 2016-06-30 (×5): qty 80

## 2016-06-30 MED ORDER — FENTANYL CITRATE (PF) 100 MCG/2ML IJ SOLN
50.0000 ug | INTRAMUSCULAR | Status: DC | PRN
  Administered 2016-07-01 (×2): 50 ug via INTRAVENOUS
  Filled 2016-06-30 (×3): qty 2

## 2016-06-30 MED ORDER — PANTOPRAZOLE SODIUM 40 MG IV SOLR: 40.0000 mg | Freq: Two times a day (BID) | INTRAVENOUS | Status: DC

## 2016-06-30 MED ORDER — SODIUM CHLORIDE 0.9 % IV SOLN
80.0000 mg | Freq: Once | INTRAVENOUS | Status: AC
  Administered 2016-06-30: 80 mg via INTRAVENOUS
  Filled 2016-06-30: qty 80

## 2016-06-30 MED ORDER — SODIUM CHLORIDE 0.9 % IV BOLUS (SEPSIS)
1000.0000 mL | Freq: Once | INTRAVENOUS | Status: AC
  Administered 2016-06-30: 1000 mL via INTRAVENOUS

## 2016-06-30 MED ORDER — ETOMIDATE 2 MG/ML IV SOLN
INTRAVENOUS | Status: AC | PRN
  Administered 2016-06-30: 20 mg via INTRAVENOUS

## 2016-06-30 MED ORDER — SODIUM CHLORIDE 0.9 % IV SOLN: 25.0000 ug/h | INTRAVENOUS | Status: DC

## 2016-06-30 MED ORDER — FENTANYL CITRATE (PF) 100 MCG/2ML IJ SOLN
50.0000 ug | INTRAMUSCULAR | Status: DC | PRN
  Administered 2016-06-30 (×2): 50 ug via INTRAVENOUS
  Filled 2016-06-30: qty 2

## 2016-06-30 MED ORDER — CALCIUM GLUCONATE 10 % IV SOLN
1.0000 g | Freq: Once | INTRAVENOUS | Status: AC
  Administered 2016-06-30: 1 g via INTRAVENOUS
  Filled 2016-06-30: qty 10

## 2016-06-30 MED ORDER — BUDESONIDE 0.25 MG/2ML IN SUSP
0.2500 mg | Freq: Two times a day (BID) | RESPIRATORY_TRACT | Status: DC
  Administered 2016-07-01 – 2016-07-04 (×7): 0.25 mg via RESPIRATORY_TRACT
  Filled 2016-06-30 (×9): qty 2

## 2016-06-30 MED ORDER — SODIUM CHLORIDE 0.9 % IV SOLN
1.0000 mg/h | INTRAVENOUS | Status: DC
  Filled 2016-06-30: qty 10

## 2016-06-30 MED ORDER — DILTIAZEM HCL 25 MG/5ML IV SOLN
10.0000 mg | Freq: Once | INTRAVENOUS | Status: DC
  Filled 2016-06-30: qty 5

## 2016-06-30 MED ORDER — FENTANYL 2500MCG IN NS 250ML (10MCG/ML) PREMIX INFUSION
25.0000 ug/h | INTRAVENOUS | Status: DC
  Administered 2016-06-30: 25 ug/h via INTRAVENOUS
  Filled 2016-06-30: qty 250

## 2016-06-30 MED ORDER — PIPERACILLIN-TAZOBACTAM 3.375 G IVPB
3.3750 g | Freq: Three times a day (TID) | INTRAVENOUS | Status: DC
  Administered 2016-07-01 – 2016-07-02 (×5): 3.375 g via INTRAVENOUS
  Filled 2016-06-30 (×6): qty 50

## 2016-06-30 MED ORDER — PIPERACILLIN-TAZOBACTAM 3.375 G IVPB 30 MIN
3.3750 g | Freq: Once | INTRAVENOUS | Status: AC
  Administered 2016-06-30: 3.375 g via INTRAVENOUS
  Filled 2016-06-30: qty 50

## 2016-06-30 MED ORDER — METOPROLOL TARTRATE 5 MG/5ML IV SOLN
5.0000 mg | Freq: Once | INTRAVENOUS | Status: DC
  Filled 2016-06-30: qty 5

## 2016-06-30 MED ORDER — DEXTROSE 5 % IV SOLN
1.0000 g | Freq: Once | INTRAVENOUS | Status: AC
  Administered 2016-06-30: 1 g via INTRAVENOUS
  Filled 2016-06-30: qty 10

## 2016-06-30 MED ORDER — MIDAZOLAM HCL 2 MG/2ML IJ SOLN
1.0000 mg | INTRAMUSCULAR | Status: DC | PRN
  Filled 2016-06-30: qty 2

## 2016-06-30 MED ORDER — SODIUM CHLORIDE 0.9 % IV SOLN: 250.0000 mL | INTRAVENOUS | Status: DC | PRN

## 2016-06-30 MED ORDER — SODIUM CHLORIDE 0.9 % IV BOLUS (SEPSIS): 1000.0000 mL | Freq: Once | INTRAVENOUS | Status: DC

## 2016-06-30 MED ORDER — SODIUM BICARBONATE 8.4 % IV SOLN
50.0000 meq | Freq: Once | INTRAVENOUS | Status: AC
  Administered 2016-06-30: 50 meq via INTRAVENOUS
  Filled 2016-06-30: qty 50

## 2016-06-30 MED ORDER — DILTIAZEM HCL-DEXTROSE 100-5 MG/100ML-% IV SOLN (PREMIX): 5.0000 mg/h | INTRAVENOUS | Status: DC

## 2016-06-30 MED ORDER — VANCOMYCIN HCL IN DEXTROSE 1-5 GM/200ML-% IV SOLN
1000.0000 mg | INTRAVENOUS | Status: DC
  Administered 2016-07-01: 1000 mg via INTRAVENOUS
  Filled 2016-06-30 (×2): qty 200

## 2016-06-30 MED ORDER — SODIUM CHLORIDE 0.9 % IV SOLN
INTRAVENOUS | Status: DC
  Administered 2016-06-30 – 2016-07-03 (×5): via INTRAVENOUS

## 2016-06-30 MED ORDER — MIDAZOLAM HCL 2 MG/2ML IJ SOLN
1.0000 mg | INTRAMUSCULAR | Status: DC | PRN
  Administered 2016-06-30 – 2016-07-01 (×3): 1 mg via INTRAVENOUS
  Filled 2016-06-30 (×2): qty 2

## 2016-06-30 MED ORDER — PROPOFOL 1000 MG/100ML IV EMUL: 5.0000 ug/kg/min | INTRAVENOUS | Status: DC

## 2016-06-30 MED ORDER — ROCURONIUM BROMIDE 50 MG/5ML IV SOLN
INTRAVENOUS | Status: AC | PRN
  Administered 2016-06-30: 87 mg via INTRAVENOUS

## 2016-06-30 MED ORDER — DILTIAZEM LOAD VIA INFUSION
10.0000 mg | Freq: Once | INTRAVENOUS | Status: DC
  Filled 2016-06-30: qty 10

## 2016-06-30 MED ORDER — IOPAMIDOL (ISOVUE-300) INJECTION 61%
INTRAVENOUS | Status: AC
Start: 2016-06-30 — End: 2016-06-30
  Administered 2016-06-30: 80 mL
  Filled 2016-06-30: qty 100

## 2016-06-30 MED ORDER — SODIUM CHLORIDE 0.9 % IV SOLN
20.0000 ug/h | INTRAVENOUS | Status: DC
  Filled 2016-06-30: qty 50

## 2016-06-30 MED ORDER — VANCOMYCIN HCL 10 G IV SOLR
1750.0000 mg | Freq: Once | INTRAVENOUS | Status: AC
  Administered 2016-06-30: 1750 mg via INTRAVENOUS
  Filled 2016-06-30: qty 1750

## 2016-06-30 NOTE — ED Provider Notes (Signed)
Called to bedside. Patient 82 year old male admitted for lethargy and weakness. Called to bedside for increased work of breathing and hypoxia with gurgling respirations  Patient tachycardic to the 140s, to 230s, gurgling respirations and slumped over. Warm to the touch. Diffuse rhonchi on lung exam.  Patient with accessory muscle use, increased worker breathing, unable to speak. Family at bedside reports he is a full code and wanted everything done.  Suspect patient had aspiration. He is intubated for respiratory distress. Updated admitted resident team Dr. Karma GreaserBoswell at bedside.  INTUBATION Performed by: Glynn OctaveANCOUR, Kaytlan Behrman  Required items: required blood products, implants, devices, and special equipment available Patient identity confirmed: provided demographic data and hospital-assigned identification number Time out: Immediately prior to procedure a "time out" was called to verify the correct patient, procedure, equipment, support staff and site/side marked as required.  Indications: respiratory insufficiency  Intubation method: Glidescope Laryngoscopy   Preoxygenation: BVM  Sedatives: 20 Etomidate Paralytic: 100 Rocuronium  Tube Size: 7.5 cuffed  Post-procedure assessment: chest rise and ETCO2 monitor Breath sounds: equal and absent over the epigastrium Tube secured with: ETT holder Chest x-ray interpreted by radiologist and me.  Chest x-ray findings: endotracheal tube in appropriate position  Patient tolerated the procedure well with no immediate complications.   Intubation performed as well. Patient tolerated well. Sinus tachycardia on EKG. Repeat labs will be sent. Chest x-ray shows ET tube in good position.  Antibiotics escalated to vancomycin and Zosyn. Dark emesis and the NG tube  Admission discussed with critical care  CRITICAL CARE Performed by: Glynn OctaveANCOUR, Mandi Mattioli Total critical care time: 35 minutes Critical care time was exclusive of separately billable  procedures and treating other patients. Critical care was necessary to treat or prevent imminent or life-threatening deterioration. Critical care was time spent personally by me on the following activities: development of treatment plan with patient and/or surrogate as well as nursing, discussions with consultants, evaluation of patient's response to treatment, examination of patient, obtaining history from patient or surrogate, ordering and performing treatments and interventions, ordering and review of laboratory studies, ordering and review of radiographic studies, pulse oximetry and re-evaluation of patient's condition.        Glynn OctaveStephen Kriston Mckinnie, MD 07/01/16 [phone]

## 2016-06-30 NOTE — ED Notes (Signed)
Cleaned patient up pt had stool on him

## 2016-06-30 NOTE — Progress Notes (Signed)
Patient transported from ED room A-6 to CT and to 2M12 with no complications.

## 2016-06-30 NOTE — ED Notes (Signed)
Patient transported to CT 

## 2016-06-30 NOTE — ED Provider Notes (Signed)
[facility]-EMERGENCY DEPT Provider Note   CSN: [id number] Arrival date & time: 06/30/16  0844     History   Chief Complaint Chief Complaint  Patient presents with  . Weakness    HPI Dayson Aboud is a 82 y.o. male.  HPI   Patient is a 82 year old male presenting by EMS for lethargy and weakness. Patient does not have any family with him and is alert only to self.  Level 5 caveat altered mental status.  Past Medical History:  Diagnosis Date  . Diabetes mellitus without complication (HCC)   . Glaucoma     Patient Active Problem List   Diagnosis Date Noted  . Weakness 06/30/2016  . Acute bronchitis 02/14/2016  . Dark stools 02/11/2016  . UTI (urinary tract infection) 01/30/2016  . Aspiration pneumonia (HCC) 01/30/2016  . Pressure ulcer 01/30/2016  . GI bleed 01/29/2016  . Sensorineural hearing loss, bilateral 12/30/2015  . Callus of foot 12/30/2015  . Edentulous 12/30/2015  . Incontinence of urine 12/30/2015  . Palliative care encounter 05/27/2015  . Advanced care planning 05/27/2015  . Diabetes mellitus with chronic kidney disease (HCC)   . Glaucoma     No past surgical history on file.     Home Medications    Prior to Admission medications   Medication Sig Start Date End Date Taking? Authorizing Provider  docusate sodium (COLACE) 100 MG capsule Take 1 capsule (100 mg total) by mouth daily as needed. 02/09/16 02/08/17 Yes Valentino Nose, MD  Multiple Vitamin (MULTIVITAMIN WITH MINERALS) TABS tablet Take 1 tablet by mouth daily.   Yes Historical Provider, MD  pantoprazole (PROTONIX) 40 MG tablet Take 1 tablet (40 mg total) by mouth daily. 02/19/16  Yes Lora Paula, MD    Family History No family history on file.  Social History Social History  Substance Use Topics  . Smoking status: Former Games developer  . Smokeless tobacco: Former Neurosurgeon    Quit date: 12/28/1973  . Alcohol use No     Allergies   Review of patient's allergies indicates no known  allergies.   Review of Systems Review of Systems  Unable to perform ROS: Mental status change     Physical Exam Updated Vital Signs BP 112/63   Pulse 118   Temp 99.6 F (37.6 C) (Rectal)   Resp 20   SpO2 94%   Physical Exam  Constitutional: He is oriented to person, place, and time. He appears well-nourished.  HENT:  Head: Normocephalic.  Eyes: Conjunctivae are normal.  Left eye abnormally shaped pupil.  Cardiovascular: Normal heart sounds.   No murmur heard. Tachycardic.  Pulmonary/Chest: Effort normal and breath sounds normal. No respiratory distress. He has no wheezes. He has no rales.  Abdominal: There is tenderness.  Patient has diffuse tenderness of abdomen.  Genitourinary:  Genitourinary Comments: Normal stool, no melena, fully formed.  Musculoskeletal:  Chronic contractures  Neurological: He is oriented to person, place, and time.  Skin: Skin is warm and dry. He is not diaphoretic.  Psychiatric: He has a normal mood and affect. His behavior is normal.  Nursing note and vitals reviewed.    ED Treatments / Results  Labs (all labs ordered are listed, but only abnormal results are displayed) Labs Reviewed  CBC WITH DIFFERENTIAL/PLATELET - Abnormal; Notable for the following:       Result Value   Hemoglobin 12.3 (*)    HCT 37.1 (*)    RDW 17.0 (*)    Lymphs Abs 0.6 (*)  All other components within normal limits  COMPREHENSIVE METABOLIC PANEL - Abnormal; Notable for the following:    Potassium 5.5 (*)    CO2 21 (*)    Glucose, Bld 183 (*)    BUN 42 (*)    Creatinine, Ser 1.66 (*)    Calcium 8.4 (*)    Total Protein 6.1 (*)    Albumin 3.1 (*)    Total Bilirubin 1.4 (*)    GFR calc non Af Amer 34 (*)    GFR calc Af Amer 40 (*)    All other components within normal limits  URINALYSIS, ROUTINE W REFLEX MICROSCOPIC (NOT AT [facility]) - Abnormal; Notable for the following:    APPearance CLOUDY (*)    Hgb urine dipstick LARGE (*)    Protein, ur 30 (*)     Leukocytes, UA MODERATE (*)    All other components within normal limits  URINE MICROSCOPIC-ADD ON - Abnormal; Notable for the following:    Squamous Epithelial / LPF 0-5 (*)    Bacteria, UA FEW (*)    Casts GRANULAR CAST (*)    All other components within normal limits  I-STAT CG4 LACTIC ACID, ED - Abnormal; Notable for the following:    Lactic Acid, Venous 2.84 (*)    All other components within normal limits  I-STAT CHEM 8, ED - Abnormal; Notable for the following:    Potassium 5.5 (*)    BUN 62 (*)    Creatinine, Ser 1.60 (*)    Glucose, Bld 184 (*)    Calcium, Ion 1.10 (*)    Hemoglobin 12.9 (*)    HCT 38.0 (*)    All other components within normal limits  URINE CULTURE  TROPONIN I  PROTIME-INR  AMMONIA  OCCULT BLOOD X 1 CARD TO LAB, STOOL  I-STAT TROPOININ, ED  POC OCCULT BLOOD, ED  TYPE AND SCREEN  ABO/RH    EKG  EKG Interpretation  Date/Time:  Thursday June 30 2016 08:52:52 EDT Ventricular Rate:  139 PR Interval:    QRS Duration: 124 QT Interval:  319 QTC Calculation: 486 R Axis:   30 Text Interpretation:  Since last tracing rate faster Confirmed by Kandis Mannan ([id number]) on 06/30/2016 1:35:06 PM       Radiology Ct Abdomen Pelvis W Contrast  Result Date: 06/30/2016 CLINICAL DATA:  Diffuse abdominal pain, weakness, history of GI bleed EXAM: CT ABDOMEN AND PELVIS WITH CONTRAST TECHNIQUE: Multidetector CT imaging of the abdomen and pelvis was performed using the standard protocol following bolus administration of intravenous contrast. CONTRAST:  80mL ISOVUE-300 IOPAMIDOL (ISOVUE-300) INJECTION 61% COMPARISON:  CT abdomen pelvis of 01/29/2016 FINDINGS: Lower chest: There still opacity in the left lower lobe which could conceivably represent pleural parenchymal scarring, but pneumonia cannot be excluded. The right lung base is clear. Hepatobiliary: Several small scattered low-attenuation structures throughout the liver are most consistent with hepatic cysts.  No calcified gallstones are seen within the slightly distended gallbladder. Pancreas: The pancreas appears somewhat atrophic. The pancreatic duct is not dilated. Spleen: Spleen is unremarkable. Adrenals/Urinary Tract: The right adrenal gland is stable. A probable small left adrenal adenoma also appears stable. The kidneys enhance with no calculus or mass. There is somewhat diminished profusion noted within the lower poles of both kidneys. Some of this lower attenuation could be artifactual, due to the patient's arms lying beside him, but focal pyelonephritis cannot be excluded and clinical correlation is recommended. The ureters are normal in caliber. The urinary bladder is not well  distended and appears therefore somewhat thick walled. Stomach/Bowel: The stomach is fluid distended with no abnormality evident. There is no change in minimally prominent small bowel loops compared to the prior CT. Possibly indicating mild ileus. There is some rectal distention with feces and there are scattered rectosigmoid colon diverticula present. The terminal ileum is unremarkable. The appendix is not definitely visualized. Vascular/Lymphatic: The abdominal aorta is normal in caliber with moderate abdominal aortic atherosclerosis present. Reproductive: The prostate is normal in size. Other: Fat does enter the right inguinal canal as noted previously. Musculoskeletal: There is degenerative change throughout the lumbar spine. No compression deformity is seen. Degenerative change also is noted in the hips. IMPRESSION: 1. Persistent opacity in the left lower lobe could be due to scarring, but pneumonia cannot be excluded. Correlate clinically. 2. Somewhat diminished perfusion of the lower poles of both kidneys may be artifactual as noted above, but focal pyelonephritis cannot be excluded. 3. Somewhat thick-walled urinary bladder. Cannot exclude inflammation as with cystitis. 4. Moderate abdominal aortic atherosclerosis. 5. Fat does  enter the right inguinal canal. Electronically Signed   By: Dwyane DeePaul  Barry M.D.   On: 06/30/2016 11:03   Dg Chest Portable 1 View  Result Date: 06/30/2016 CLINICAL DATA:  patient is more lethargic and weak today, unable to stand. Patient currently opens eyes spontaneously but is weak. Patient denies any complaints of pain. He is alert and oriented to self and place only. Recently in the hospital for pneumonia and GI bleed EXAM: PORTABLE CHEST - 1 VIEW COMPARISON:  01/29/2016 FINDINGS: Prominent perihilar interstitial markings left greater than right as before. No confluent airspace disease or overt edema. Heart size upper limits normal for technique.  Atheromatous aorta. No pneumothorax.  No effusion. Visualized bones unremarkable. IMPRESSION: 1. No acute disease. 2.  Aortic Atherosclerosis (ICD10-170.0) Electronically Signed   By: Corlis Leak  Hassell M.D.   On: 06/30/2016 10:09    Procedures Procedures (including critical care time)  Medications Ordered in ED Medications  cefTRIAXone (ROCEPHIN) 1 g in dextrose 5 % 50 mL IVPB (not administered)  diltiazem (CARDIZEM) injection 10 mg (not administered)  sodium chloride 0.9 % bolus 1,000 mL (1,000 mLs Intravenous New Bag/Given 06/30/16 0916)  iopamidol (ISOVUE-300) 61 % injection (80 mLs  Contrast Given 06/30/16 1028)     Initial Impression / Assessment and Plan / ED Course  I have reviewed the triage vital signs and the nursing notes.  Pertinent labs & imaging results that were available during my care of the patient were reviewed by me and considered in my medical decision making (see chart for details).  Clinical Course    Patient is a 82 year old male alert and oriented toward self. Patient was brought in by family for global weakness. On exam patient has tenderness to his abdomen, tachycardia. Patient had recent admission for GI bleed CK D and diabetes. Recent referral placed for palliative care.  Patient now has family at bedside.She reports  recent fatigue.  Wil continue with braod work up.  HR in 120s, ho afib, no meds.  Patient has abdominal pain, will do CT.  Shows cystitis, agrees with UA.  Will treat with ceftriaxone.  Will repeat EKG, give small dilt dose and admit to medicine.    Final Clinical Impressions(s) / ED Diagnoses   Final diagnoses:  Weakness    New Prescriptions New Prescriptions   No medications on file     Derisha Funderburke Randall AnLyn Casey Maxfield, MD 06/30/16 1335

## 2016-06-30 NOTE — ED Triage Notes (Addendum)
[facility] Family told EMS patient is more lethargic and weak today, unable to stand. Patient currently opens eyes spontaneously but is weak. Patient denies any complaints of pain. He is alert and oriented to self and place only. Recently in the hospital for pneumonia and GI bleed. Hx of afib. HR in the 130s. Lung sounds w/ exp wheezes. 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

## 2016-06-30 NOTE — ED Notes (Signed)
Admitting for pt; [facility]rend[facility] [phone].

## 2016-06-30 NOTE — Progress Notes (Signed)
Pharmacy Antibiotic Note  Francisco Roberts is a 82 y.o. male admitted on 06/30/2016 with sepsis.  Pharmacy has been consulted for zosyn and vancomycin dosing. Patient with dark emesis and PMH of GI bleed. Protonix infusion initiated.   Plan: Vancomycin 1750mg  IV bolus Vancomycin 1000mg  IV every 24 hours.  Goal trough 15-20 mcg/mL. Zosyn 3.375 IV q8 hours (4 hour infusion)  Height: 6\' 1"  (185.4 cm) IBW/kg (Calculated) : 79.9  Temp (24hrs), Avg:98.9 F (37.2 C), Min:98.2 F (36.8 C), Max:99.6 F (37.6 C)   Recent Labs Lab 06/30/16 0850 06/30/16 0922 06/30/16 1615 06/30/16 1645  WBC 4.6  --  8.2  --   CREATININE 1.66* 1.60* 1.74*  --   LATICACIDVEN  --  2.84*  --  1.55    CrCl cannot be calculated (Unknown ideal weight.).    No Known Allergies  Thank you for allowing pharmacy to be a part of this patient's care.  Toniann Failony L Silverio Hagan 06/30/2016 6:09 PM

## 2016-06-30 NOTE — H&P (Addendum)
PULMONARY / CRITICAL CARE MEDICINE   Name: Francisco Roberts Can MRN: [id number] DOB: [birth year]    ADMISSION DATE:  06/30/2016 CONSULTATION DATE:  06/30/16  REFERRING MD:  ED Dr. Judithann Sheenankor   CHIEF COMPLAINT:  Lethargy  HISTORY OF PRESENT ILLNESS:   [name] is a 82 year old male with PMH of DM, CKD, Glaucoma, hx og GIB in May 2017, possible history of paroxysmal Afib  initially presented to the ED with lethargy. Granddaughter provided history. She reports he was starting to get a cold yesterday with some productive cough which seemed to worsen today. Today patient reported he had right sided flank/back pain and chest pain . Therefore his grand-daughter called EMS. In the ED, he was worked up with general labs, CT abdomen/pelvis which showed a persistent opacity in LLL which could be scarring but PNA cannot be excluded and cystitis. UA was significant for moderate leukocytes.  He was waiting to be transferred to a floor bed when he started having acute respiratory distress with sob with accessory muscle use, gurgling which did not improve with suction, HR in 160s, tachypena to 30s and therefore he was intubated. There was some concern for possible aspiration. He is only on Protonix and colace at home per daughter. She denies patient having dark tarry stools. No emesis at home.   At baseline patient needs assistance with all ADLs. Uses wheelchair and walker.   Other studies obtained in the ED: CXR with no acute process. PT /INR wnl. Trop < 0.03. EKG with NSR, tachycardia, old RBBB. K 5.5 although noted of hemolysis. He was given calcium gluconate for K. He was also given 2 L bolus. Received CTX x 1.   While in the room, patient's HR would intermittently increase to 160s-u pto 170s and then decrease on its own. BP is elevated as well. His OG tube with brown/coffee ground like fluid.   PAST MEDICAL HISTORY :  He  has a past medical history of Diabetes mellitus without complication (HCC) and Glaucoma.  PAST  SURGICAL HISTORY: He  has no past surgical history on file.  No Known Allergies  No current facility-administered medications on file prior to encounter.    Current Outpatient Prescriptions on File Prior to Encounter  Medication Sig  . docusate sodium (COLACE) 100 MG capsule Take 1 capsule (100 mg total) by mouth daily as needed.  . Multiple Vitamin (MULTIVITAMIN WITH MINERALS) TABS tablet Take 1 tablet by mouth daily.  . pantoprazole (PROTONIX) 40 MG tablet Take 1 tablet (40 mg total) by mouth daily.    FAMILY HISTORY:  His has no family status information on file.    SOCIAL HISTORY: He  reports that he has quit smoking. He quit smokeless tobacco use about 42 years ago. He reports that he does not drink alcohol or use drugs.  REVIEW OF SYSTEMS:   Unable to obtain as patient is intubated    VITAL SIGNS: BP 111/69   Pulse (!) 58   Temp 99.6 F (37.6 C) (Rectal)   Resp 26   Ht 5\' 6"  (1.676 m)   SpO2 99%   HEMODYNAMICS:    VENTILATOR SETTINGS: Vent Mode: PRVC FiO2 (%):  [100 %] 100 % Set Rate:  [14 bmp] 14 bmp Vt Set:  [500 mL] 500 mL PEEP:  [5 cmH20] 5 cmH20 Plateau Pressure:  [16 cmH20] 16 cmH20  INTAKE / OUTPUT: No intake/output data recorded.  PHYSICAL EXAMINATION: GEN: Intubated  HEENT: left pupil is large with irregular border (baseline per  daughter), right pupil small and sluggish to light.  CV: tachycardia ,regular rhythm, no murmurs, rubs, or gallops PULM: intubated, overall clear with some crackles on the left lung base, no wheezing   ABD: Soft, nondistended, hypoactive bowel sounds  SKIN: No rash or cyanosis; warm and well-perfused EXTR: No lower extremity edema or calf tenderness; thready DP pulses Neuro: sedated.   LABS:  BMET  Recent Labs Lab 06/30/16 0850 06/30/16 0922  NA 141 143  K 5.5* 5.5*  CL 111 110  CO2 21*  --   BUN 42* 62*  CREATININE 1.66* 1.60*  GLUCOSE 183* 184*    Electrolytes  Recent Labs Lab 06/30/16 0850   CALCIUM 8.4*    CBC  Recent Labs Lab 06/30/16 0850 06/30/16 0922 06/30/16 1615  WBC 4.6  --  8.2  HGB 12.3* 12.9* 13.1  HCT 37.1* 38.0* 39.8  PLT 194  --  207    Coag's  Recent Labs Lab 06/30/16 0850  INR 1.05    Sepsis Markers  Recent Labs Lab 06/30/16 0922 06/30/16 1645  LATICACIDVEN 2.84* 1.55    ABG  Recent Labs Lab 06/30/16 1710  PHART 7.148*  PCO2ART 71.6*  PO2ART 393.0*    Liver Enzymes  Recent Labs Lab 06/30/16 0850  AST 40  ALT 27  ALKPHOS 107  BILITOT 1.4*  ALBUMIN 3.1*    Cardiac Enzymes  Recent Labs Lab 06/30/16 0850  TROPONINI <0.03    Glucose No results for input(s): GLUCAP in the last 168 hours.  Imaging Ct Abdomen Pelvis W Contrast  Result Date: 06/30/2016 CLINICAL DATA:  Diffuse abdominal pain, weakness, history of GI bleed EXAM: CT ABDOMEN AND PELVIS WITH CONTRAST TECHNIQUE: Multidetector CT imaging of the abdomen and pelvis was performed using the standard protocol following bolus administration of intravenous contrast. CONTRAST:  80mL ISOVUE-300 IOPAMIDOL (ISOVUE-300) INJECTION 61% COMPARISON:  CT abdomen pelvis of 01/29/2016 FINDINGS: Lower chest: There still opacity in the left lower lobe which could conceivably represent pleural parenchymal scarring, but pneumonia cannot be excluded. The right lung base is clear. Hepatobiliary: Several small scattered low-attenuation structures throughout the liver are most consistent with hepatic cysts. No calcified gallstones are seen within the slightly distended gallbladder. Pancreas: The pancreas appears somewhat atrophic. The pancreatic duct is not dilated. Spleen: Spleen is unremarkable. Adrenals/Urinary Tract: The right adrenal gland is stable. A probable small left adrenal adenoma also appears stable. The kidneys enhance with no calculus or mass. There is somewhat diminished profusion noted within the lower poles of both kidneys. Some of this lower attenuation could be  artifactual, due to the patient's arms lying beside him, but focal pyelonephritis cannot be excluded and clinical correlation is recommended. The ureters are normal in caliber. The urinary bladder is not well distended and appears therefore somewhat thick walled. Stomach/Bowel: The stomach is fluid distended with no abnormality evident. There is no change in minimally prominent small bowel loops compared to the prior CT. Possibly indicating mild ileus. There is some rectal distention with feces and there are scattered rectosigmoid colon diverticula present. The terminal ileum is unremarkable. The appendix is not definitely visualized. Vascular/Lymphatic: The abdominal aorta is normal in caliber with moderate abdominal aortic atherosclerosis present. Reproductive: The prostate is normal in size. Other: Fat does enter the right inguinal canal as noted previously. Musculoskeletal: There is degenerative change throughout the lumbar spine. No compression deformity is seen. Degenerative change also is noted in the hips. IMPRESSION: 1. Persistent opacity in the left lower lobe could be  due to scarring, but pneumonia cannot be excluded. Correlate clinically. 2. Somewhat diminished perfusion of the lower poles of both kidneys may be artifactual as noted above, but focal pyelonephritis cannot be excluded. 3. Somewhat thick-walled urinary bladder. Cannot exclude inflammation as with cystitis. 4. Moderate abdominal aortic atherosclerosis. 5. Fat does enter the right inguinal canal. Electronically Signed   By: Dwyane Dee M.D.   On: 06/30/2016 11:03   Dg Chest Portable 1 View  Result Date: 06/30/2016 CLINICAL DATA:  patient is more lethargic and weak today, unable to stand. Patient currently opens eyes spontaneously but is weak. Patient denies any complaints of pain. He is alert and oriented to self and place only. Recently in the hospital for pneumonia and GI bleed EXAM: PORTABLE CHEST - 1 VIEW COMPARISON:  01/29/2016  FINDINGS: Prominent perihilar interstitial markings left greater than right as before. No confluent airspace disease or overt edema. Heart size upper limits normal for technique.  Atheromatous aorta. No pneumothorax.  No effusion. Visualized bones unremarkable. IMPRESSION: 1. No acute disease. 2.  Aortic Atherosclerosis (ICD10-170.0) Electronically Signed   By: Corlis Leak M.D.   On: 06/30/2016 10:09     STUDIES:  CT abdomen/Pelvis 10/19: Persistent opacity in the left lower lobe could be due to scarring, but pneumonia cannot be excluded. Somewhat diminished perfusion of the lower poles of both kidneys may be artifactual as noted above, but focal pyelonephritis cannot be excluded. Somewhat thick-walled urinary bladder. Cannot exclude inflammation as with cystitis.  CXR 10/19: no acute disease  CULTURES: Blood culture 10/19 >> Tracheal Aspirate 10/19 >> Urine culture 10/19 >>  ANTIBIOTICS: S/p CTZ x 1 in ED Vanc 10/19 >> Zosyn 10/19 >>  SIGNIFICANT EVENTS: 10/19: admit to ICU for acute respiratory distress with possible aspiration PNA/penumonitis   LINES/TUBES: ETT 10/19 >> OG Tube 10/19 >> PIV x 2   DISCUSSION: Mr. Haueter is a 82 year old male with PMH of DM, CKD, Glaucoma, hx og GIB in May 2017, possible history of paroxysmal Afib  initially presented to the ED with lethargy, was found to have a UTI, and later on developed acute respiratory distress requiring intubation in the ED. Respiratory distress likely secondary to aspiration event (PNA vs pneumonitis). Initial ABG with hypercarbic respiratory acidosis after intubation. He is also tachycardic with elevated BP with SBP in 200s possibly due to inadequate sedation after intubation. He was subsequently started on Fentanyl drip and his blood pressure did not tolerate (decreased to SBP 80s). He is getting 1L bolus currently.   ASSESSMENT / PLAN:  PULMONARY A: Acute Respiratory Distress: possibly contributed by aspiration PNA vs  pneumonitis requiring intubation in ED. Initial ABG with hypercarbic respiratory acidosis likely due to initial vent settings. Vent settings changed in the ED. P:   Full vent support CXR in AM  ABG in 1 hr  AM ABG  INFECTIOUS A:   UTI Concern for possible Aspiration PNA  P:   S/p CTX in ED. Vancomycin and Zosyn for broad coverage for now  Trend procalcitonin Follow blood cx, Urine cx Obtain tracheal aspirate cx   CARDIOVASCULAR A:  Tachycardia: sinus. Possibly dehydrated.  HTN initially, now hypotensive in the setting of initiation Fentanyl for sedation. BP improving with fluids Reported Chest Pain:  EKG with no signs of ischemic changes. Initial troponin negative Questionable history of paroxysmal Afib: per grand-daughter, no mention in initial chart review. Not on meds at home. WiIl chart review to obtain further information.  P:  S/p 2 L  bolus in ED 1 L bolus ordered  ECHO in the AM  Troponins Monitor BP  Repeat lactate since patient did become hypotensive with Fentail gtt in ED  RENAL A:   CKD: Creatinine at baseline  Hyperkalemia, resolved: 5.5 in ED however hemolysis noted, repeat 4.6.  s/p calcium gluconate in ED.  Lactic Acidosis: 2.84 > 1.55. Unclear in etiology but has resolved with IVF in the ED   P:   Monitor  Follow with daily labs  Repeat lactate  GASTROINTESTINAL A:   History of GI Bleed: per chart history family declined evaluation by GI in  01/2016 and decided on conservative management with PPI. No melena. But OG tube with coffee ground fluid. Initial hgb stable. PT/INR normal  P:   H/H q 6 hours x 2  PPI drip Repeat BMP    NEUROLOGIC A:   Lethargy: possibly due to infection/UTI. Normal ammonia level. P:   RASS goal: 0 to -1 PRN Versed, PRN Fentanyl  Will obtain CT head to rule out stroke   HEMATOLOGIC A:   No acute issues P:  SCD for DVT prophylaxis due to concern for GI Bleed   ENDOCRINE A:   No issues currently  P:   Monitor  glucose with BMPs   FAMILY  - Updates:   - Inter-disciplinary family meet or Palliative Care meeting due by: 10/26   Palma Holter, MD PGY 2 Family Medicine   06/30/2016, 5:15 PM   ATTENDING NOTE / ATTESTATION NOTE :   I have discussed the case with the resident/APP Kandis Mannan  I agree with the resident/APP's  history, physical examination, assessment, and plans.    I have edited the above note and modified it according to our agreed history, physical examination, assessment and plan.   Briefly, 72M, with fair fxnal capacity (feeds himself), admitted in 01/2016 for GI Bleed, comes in with 1-2 day h/o URI sx. Pt was noted to be confused/altered at ED and was in respiratory distress.  There was "note of gurgling sounds" so he was intubated at ED.  CXR was (-) for PNA. Urine was dirty. Lactate was elevated but better.  He received 30 mls/kg at ED as part of sepsis protocol. PCCM asked to admit.  Pt seen and examined.  BP 90 systolic post intubation and after being on fentanyl and versed drips. Pt sedated, comfortable. ETT in place. (-) NVD. Good ae. CTA. Variable s1. HR fluctuates from 110-150.  (+) BS, soft. (-) masses. (-) edema. Warm extremities.  Rest of exam per resident.   Labs reviewed and as mentioned.  Initial ABG showed hypercapnea related to low RR and TV. WBC 8. Creat elevated at 1.6 but is baseline.   Assessment and Plan: 1. Acute hypoxemic resp fx 2/2 possible aspiration pneumonitis (more than PNA) and unable to protect airway. Likely with COPD but not in AE.  - cont vent support. - vent adjusted to increase minute ventilation. Rpt ABG in an hr.  - pulmicort neb BID - PST/weaning once he wakes up > anticipate quick wean  2. Concern for sepsis possible related to UTI, maybe asp pneumonitis - cont zosyn/vanc for now - panculture, repeat lacate, check PCT  3. AMS likely related to #1 and #2, R/O CVA - cranial ct scan - d/c versed and fentanyl drips;  switch to prn fentanyl  4. AKI/CKD -observe creatinine. Creatinine at baseline.  - cont IVF  5. H/O GIB - coffee ground per NGT.  - check Hb  and Hct now then q6 if rpt labs are lower - keep NPO for now.   I have spent 35 minutes of critical care time with this patient today.  Family :Family updated at length today.  Case discussed with pt's granddaughter/grandson. They confirmed pt as being DNR.    Pollie Meyer, MD 06/30/2016, 7:02 PM [facility] Pager [phone] After 3 pm or if no answer, call [phone]

## 2016-06-30 NOTE — H&P (Addendum)
Date: 06/30/2016               Patient Name:  Francisco Roberts MRN: [id number]  DOB: Nov 19, 1921 Age / Sex: 82 y.o., male   PCP: Valentino Nose, MD         Medical Service: Internal Medicine Teaching Service         Attending Physician: Dr. Bonnetta Barry att. providers found    First Contact: Dr. Eulah Pont  Pager: [phone]  Second Contact: Dr. Valentino Nose Pager: [phone]       After Hours (After 5p/  First Contact Pager: [phone]  weekends / holidays): Second Contact Pager: [phone]   Chief Complaint: lethargy and weakness   History of Present Illness: [name] is a 82 y.o. male with a PMH of GI bleed, diabetes mellitus, chronic kidney disease, urinary tract infection, and aspiration pneumonia who presents with lethargy and weakness. Mr. Wenke is able to communicate through shaking his head yes and no, it is unclear how he is at baseline and there was not family present in the room at the time of this history. He says that he was having dysuria at home, we were not able to figure out the length of these symptoms. He denies any chest pain. When speaking with his granddaughter she states that he was more tired and weak this week but was otherwise in his usual state of health. Complete review of systems could not be obtained because the patient is unable to communicate.   In the ED vital signs were significant for tachycardia. He had a K 5.5, BUN 62, SCrt 1.6, urine with negative nitrites and moderate leukocytes. He was given 1 L NS bolus and admitted for lethargy and global weakness. While in the ED he developed increased work of breathing and required intubation and transfer to the ICU.   Meds:  Prescriptions Prior to Admission  Medication Sig Dispense Refill Last Dose  . docusate sodium (COLACE) 100 MG capsule Take 1 capsule (100 mg total) by mouth daily as needed. 30 capsule 1 Past Week at Unknown time  . Multiple Vitamin (MULTIVITAMIN WITH MINERALS) TABS tablet Take 1 tablet by mouth daily.    06/29/2016 at Unknown time  . pantoprazole (PROTONIX) 40 MG tablet Take 1 tablet (40 mg total) by mouth daily. 90 tablet 0 06/29/2016 at Unknown time     Allergies: Allergies as of 06/30/2016  . (No Known Allergies)   Past Medical History:  Diagnosis Date  . Diabetes mellitus without complication (HCC)   . Glaucoma    Family History:  No family history on file. This could not be obtained as the patient is not able to communicate.   Social History:  He is a former smoker   Review of Systems: A complete ROS was negative except as per HPI.   Physical Exam: Vitals:   06/30/16 2200 06/30/16 2210 06/30/16 2220 06/30/16 2230  BP: 104/65   114/65  Pulse: (!) 101 (!) 58 85 98  Resp: 20 20 20 19   Temp: 98.4 F (36.9 C) 98.6 F (37 C) 98.2 F (36.8 C) 98.6 F (37 C)  TempSrc:      SpO2: 100% 100% 100% 100%  Weight:      Height:       Physical Exam  Constitutional: No distress.  Chronically ill appearing older gentleman  He has gurgling breath sounds and says yes and nods his head but is otherwise not able to communicate  HENT:  Head:  Normocephalic and atraumatic.  adentulous Gurgling sounds with breathing   Eyes:  Right eye dilated and non responsive   Cardiovascular: Normal rate and regular rhythm.   No murmur heard. Pulmonary/Chest: No respiratory distress. He has no wheezes. He has rales.  Abdominal: Soft. Bowel sounds are normal. There is tenderness. There is guarding.  Musculoskeletal: He exhibits no edema or tenderness.  Skin: Skin is warm and dry. He is not diaphoretic.    Labs: CBC:  Recent Labs Lab 06/30/16 0850 06/30/16 0922 06/30/16 1615 06/30/16 1837  WBC 4.6  --  8.2  --   NEUTROABS 3.7  --  6.4  --   HGB 12.3* 12.9* 13.1 11.7*  HCT 37.1* 38.0* 39.8 36.6*  MCV 86.9  --  87.7  --   PLT 194  --  207  --     Basic Metabolic Panel:  Recent Labs Lab 06/30/16 0850 06/30/16 0922 06/30/16 1615 06/30/16 1837  NA 141 143 141 144  K 5.5* 5.5*  4.6 4.3  CL 111 110 110 113*  CO2 21*  --  22 21*  GLUCOSE 183* 184* 188* 184*  BUN 42* 62* 45* 45*  CREATININE 1.66* 1.60* 1.74* 1.76*  CALCIUM 8.4*  --  8.5* 8.1*    Cardiac Enzymes:  Recent Labs Lab 06/30/16 0850 06/30/16 0925 06/30/16 1837  TROPONINI <0.03  --  0.03*  TROPIPOC  --  0.02  --    Coagulation Studies:  Recent Labs  06/30/16 0850  LABPROT 13.8  INR 1.05   Liver Function Tests:  Recent Labs Lab 06/30/16 0850 06/30/16 1615  AST 40 21  ALT 27 18  ALKPHOS 107 103  BILITOT 1.4* 0.3  PROT 6.1* 6.4*  ALBUMIN 3.1* 3.2*   No results for input(s): LIPASE, AMYLASE in the last 168 hours.  Recent Labs Lab 06/30/16 0959  AMMONIA 20   CBG: Lab Results  Component Value Date   HGBA1C 6.7 12/29/2015   Urine Studies: Urinalysis    Component Value Date/Time   COLORURINE YELLOW 06/30/2016 1205   APPEARANCEUR CLOUDY (A) 06/30/2016 1205   LABSPEC 1.026 06/30/2016 1205   PHURINE 5.0 06/30/2016 1205   GLUCOSEU NEGATIVE 06/30/2016 1205   HGBUR LARGE (A) 06/30/2016 1205   BILIRUBINUR NEGATIVE 06/30/2016 1205   KETONESUR NEGATIVE 06/30/2016 1205   PROTEINUR 30 (A) 06/30/2016 1205   UROBILINOGEN 1.0 05/24/2015 0130   NITRITE NEGATIVE 06/30/2016 1205   LEUKOCYTESUR MODERATE (A) 06/30/2016 1205   EKG: EKG: right bundle branch block, abnormal EKG   Imaging: Chest xray deceased vasculature in distal airspaces  CT abdomen   Assessment & Plan by Problem: Active Problems:   Weakness   Acute hypoxemic respiratory failure (HCC)  1.Weakness  [name] is a 82 y.o. male with PMH GI bleed, diabetes mellitus, chronic kidney disease, urinary tract infection, and aspiration pneumonia. Presented today with weakness and lethargy and developed respiratory distress and required intubation and transfer to the ICU. The cause of these symptoms is unclear, he was afebrile and had no leukocytosis but tachycardic. He dose have increased bands on differential which  suggest infection. This could be sepsis. CT abdomen showed dilated bowel loops and he had diffuse abdominal tenderness and guarding that could be related to constipation or he may have developed toxic ileus.   Urinary tract infection  CT scan showed possible pyelonephritis. He dose have a history of urinary tract infection and was able to confirm that he has been experiencing dysuria. Urinalysis showed moderate  leukocytes and was negative for nitrates.   Chronic Kidney disease  On admission SCrt 1.6 with baseline 1.8.   F received 1 L normal saline bolus in the ED  E Hyperkalemia  N NPO   DVT Ppx subq heparin  Code Status we confirmed that with his granddaughter that she wanted him to be full code. During consent for intubation her husband stopped [place] and became upset that we were asking the patient for consent. He stated "why are you asking him, he's in and out, he doesn't know"  Dispo: Admit patient to Inpatient with expected length of stay greater than 2 midnights.  Signed: Eulah Pont, MD 06/30/2016, 11:18 PM  Pager: [phone]   Attending note/attestation note: I have seen and evaluated the patient with the residents this AM on morning rounds. Case d/w residents in detail. Patient is a 82 y/o male with PMH of DM, CKD and aspiration PNA initially admitted with worsening lethargy and weakness. History obtained from chart as patient is currently intubated. Patient had complained of dysuria at the time of admission and was admitted for possible UTI. While in the ED he developed increased work of breathing and was intubated and transferred to the ICU for acute hypoxemic resp failure. When I examined him this AM he was intubated and was noted to be tachycardic on exam with some bibasilar crackles. He likely had acute decompensation of his resp status secondary to aspiration PNA v/s pneumonitis. He was extubated this AM by PCCM. Will f/u blood cultures- NGTD. Urine cx with coag neg staph - unlikely  UTI. Will c/w zosyn and vanco per PCCM.  Will follow patient once he is transferred out of ICU.

## 2016-06-30 NOTE — Progress Notes (Signed)
Responded to page from ED page to support patient family at bedside. Daughter and son- law at bedside. Daughter fearful of loosing father.  Daughetr recently loss her mother and this is very difficult for her.  Daughter is supported by her husband and requested prayer for patient and family.  Prayed with patient and for family. Provided empathetic listening, ministry of presence emotional and spiritual support. Will pass on to chaplain colleague for on going support when patient get to unit.   06/30/16 1600  Clinical Encounter Type  Visited With Patient;Family;Health care provider  Visit Type Initial;Spiritual support;ED  Referral From Nurse  Spiritual Encounters  Spiritual Needs Prayer;Emotional  Stress Factors  Family Stress Factors Exhausted;Health changes  Venida JarvisWatlington, Ranell Skibinski, VirginiaChaplain,pager [phone]

## 2016-07-01 ENCOUNTER — Inpatient Hospital Stay (HOSPITAL_COMMUNITY): Payer: Medicare HMO

## 2016-07-01 LAB — BASIC METABOLIC PANEL
ANION GAP: 8 (ref 5–15)
BUN: 44 mg/dL — AB (ref 6–20)
CALCIUM: 8 mg/dL — AB (ref 8.9–10.3)
CO2: 21 mmol/L — ABNORMAL LOW (ref 22–32)
CREATININE: 1.72 mg/dL — AB (ref 0.61–1.24)
Chloride: 115 mmol/L — ABNORMAL HIGH (ref 101–111)
GFR calc Af Amer: 38 mL/min — ABNORMAL LOW (ref >60)
GFR, EST NON AFRICAN AMERICAN: 33 mL/min — AB (ref >60)
GLUCOSE: 125 mg/dL — AB (ref 65–99)
Potassium: 4.6 mmol/L (ref 3.5–5.1)
Sodium: 144 mmol/L (ref 135–145)

## 2016-07-01 LAB — POCT I-STAT 3, ART BLOOD GAS (G3+)
Acid-base deficit: 3 mmol/L — ABNORMAL HIGH (ref 0.0–2.0)
BICARBONATE: 22.2 mmol/L (ref 20.0–28.0)
O2 SAT: 100 %
TCO2: 23 mmol/L (ref 0–100)
pCO2 arterial: 37.4 mmHg (ref 32.0–48.0)
pH, Arterial: 7.382 (ref 7.350–7.450)
pO2, Arterial: 271 mmHg — ABNORMAL HIGH (ref 83.0–108.0)

## 2016-07-01 LAB — CBC
HCT: 35.9 % — ABNORMAL LOW (ref 39.0–52.0)
Hemoglobin: 11.5 g/dL — ABNORMAL LOW (ref 13.0–17.0)
MCH: 28.2 pg (ref 26.0–34.0)
MCHC: 32 g/dL (ref 30.0–36.0)
MCV: 88 fL (ref 78.0–100.0)
PLATELETS: 178 10*3/uL (ref 150–400)
RBC: 4.08 MIL/uL — ABNORMAL LOW (ref 4.22–5.81)
RDW: 17.1 % — AB (ref 11.5–15.5)
WBC: 7.3 10*3/uL (ref 4.0–10.5)

## 2016-07-01 LAB — MAGNESIUM: Magnesium: 1.9 mg/dL (ref 1.7–2.4)

## 2016-07-01 LAB — MRSA PCR SCREENING: MRSA BY PCR: NEGATIVE

## 2016-07-01 LAB — PHOSPHORUS: Phosphorus: 3.6 mg/dL (ref 2.5–4.6)

## 2016-07-01 LAB — TROPONIN I: Troponin I: 0.03 ng/mL (ref <0.03)

## 2016-07-01 LAB — PROCALCITONIN: Procalcitonin: 1.98 ng/mL

## 2016-07-01 MED ORDER — ORAL CARE MOUTH RINSE
15.0000 mL | Freq: Two times a day (BID) | OROMUCOSAL | Status: DC
  Administered 2016-07-01 – 2016-07-04 (×4): 15 mL via OROMUCOSAL

## 2016-07-01 MED ORDER — ORAL CARE MOUTH RINSE
15.0000 mL | Freq: Four times a day (QID) | OROMUCOSAL | Status: DC
  Administered 2016-07-01: 15 mL via OROMUCOSAL

## 2016-07-01 MED ORDER — CHLORHEXIDINE GLUCONATE 0.12% ORAL RINSE (MEDLINE KIT)
15.0000 mL | Freq: Two times a day (BID) | OROMUCOSAL | Status: DC
  Administered 2016-06-30 – 2016-07-01 (×2): 15 mL via OROMUCOSAL

## 2016-07-01 MED ORDER — FENTANYL CITRATE (PF) 100 MCG/2ML IJ SOLN: 12.5000 ug | INTRAMUSCULAR | Status: DC | PRN

## 2016-07-01 MED ORDER — PANTOPRAZOLE SODIUM 40 MG IV SOLR
40.0000 mg | Freq: Every day | INTRAVENOUS | Status: DC
  Administered 2016-07-01 – 2016-07-03 (×3): 40 mg via INTRAVENOUS
  Filled 2016-07-01 (×3): qty 40

## 2016-07-01 MED ORDER — CHLORHEXIDINE GLUCONATE 0.12 % MT SOLN
15.0000 mL | Freq: Two times a day (BID) | OROMUCOSAL | Status: DC
  Administered 2016-07-01 – 2016-07-04 (×6): 15 mL via OROMUCOSAL
  Filled 2016-07-01 (×4): qty 15

## 2016-07-01 NOTE — Care Management Note (Addendum)
Case Management Note  Patient Details  Name: Francisco Roberts MRN: [id number] Date of Birth: May 09, 1922  Subjective/Objective:     Pt admitted with lethargy - intubated                 Action/Plan:  Pt extubated this am.  From home with granddaughter.  CM spoke with bedside nurse - PT/OT eval determined beneficial - bedside nurse to request from attending.  CM will continue to follow for discharge needs   Expected Discharge Date:                  Expected Discharge Plan:     In-House Referral:     Discharge planning Services  CM Consult  Post Acute Care Choice:    Choice offered to:     DME Arranged:    DME Agency:     HH Arranged:    HH Agency:     Status of Service:  In process, will continue to follow  If discussed at Long Length of Stay Meetings, dates discussed:    Additional Comments:  Cherylann ParrClaxton, Blue Winther S, RN 07/01/2016, 4:23 PM

## 2016-07-01 NOTE — Procedures (Signed)
Extubation Procedure Note  Patient Details:   Name: [name] Newlun DOB: [birth year] MRN: [id number]   Airway Documentation:     Evaluation  O2 sats: stable throughout Complications: No apparent complications Patient did tolerate procedure well. Bilateral Breath Sounds: Rhonchi   Yes  Melanee Spryelson, Aeryn Medici Lawson 07/01/2016, 2:22 PM

## 2016-07-01 NOTE — H&P (Signed)
PULMONARY / CRITICAL CARE MEDICINE   Name: Francisco Roberts MRN: [id number] DOB: 09/26/1921    ADMISSION DATE:  06/30/2016 CONSULTATION DATE:  06/30/16  REFERRING MD:  ED Dr. Judithann Sheen   CHIEF COMPLAINT:  Lethargy  BRIEF Francisco Roberts is a 82 year old male with PMH of DM, CKD, Glaucoma, hx og GIB in May 2017, possible history of paroxysmal Afib  initially presented to the ED with lethargy. Granddaughter provided history. She reports he was starting to get a cold yesterday with some productive cough which seemed to worsen today. Today patient reported he had right sided flank/back pain and chest pain . Therefore his grand-daughter called EMS. In the ED, he was worked up with general labs, CT abdomen/pelvis which showed a persistent opacity in LLL which could be scarring but PNA cannot be excluded and cystitis. UA was significant for moderate leukocytes.  He was waiting to be transferred to a floor bed when he started having acute respiratory distress with sob with accessory muscle use, gurgling which did not improve with suction, HR in 160s, tachypena to 30s and therefore he was intubated. There was some concern for possible aspiration. He is only on Protonix and colace at home per daughter. She denies patient having dark tarry stools. No emesis at home.   At baseline patient needs assistance with all ADLs. Uses wheelchair and walker.   Other studies obtained in the ED: CXR with no acute process. PT /INR wnl. Trop < 0.03. EKG with NSR, tachycardia, old RBBB. K 5.5 although noted of hemolysis. He was given calcium gluconate for K. He was also given 2 L bolus. Received CTX x 1.   While in the room, patient's HR would intermittently increase to 160s-u pto 170s and then decrease on its own. BP is elevated as well. His OG tube with brown/coffee ground like fluid.    He  has a past medical history of Diabetes mellitus without complication (HCC) and Glaucoma.   He  has no past surgical history on file.      STUDIES:  CT abdomen/Pelvis 10/19: Persistent opacity in the left lower lobe could be due to scarring, but pneumonia cannot be excluded. Somewhat diminished perfusion of the lower poles of both kidneys may be artifactual as noted above, but focal pyelonephritis cannot be excluded. Somewhat thick-walled urinary bladder. Cannot exclude inflammation as with cystitis.  CXR 10/19: no acute disease  CULTURES: Blood culture 10/19 >> Tracheal Aspirate 10/19 >> Urine culture 10/19 >>  ANTIBIOTICS: S/p CTZ x 1 in ED Vanc 10/19 >> Zosyn 10/19 >>  SIGNIFICANT EVENTS: 10/19: admit to ICU for acute respiratory distress with possible aspiration PNA/penumonitis   SUBJECTIVE/OVERNIGHT/INTERVAL HX 07/01/2016  - meets extubation criteira though somewhat sleepy. RN and grandaughter say he is takng a nap and is not on sedation. Family wishes fore xtubation n ow. Not sure about reintubation goals     VITAL SIGNS: BP 92/62   Pulse (!) 116   Temp 99.3 F (37.4 C)   Resp 17   Ht 6\' 2"  (1.88 m)   Wt 85.7 kg (188 lb 14.4 oz)   SpO2 100%   BMI 24.25 kg/m   HEMODYNAMICS:    VENTILATOR SETTINGS: Vent Mode: CPAP;PSV FiO2 (%):  [30 %-100 %] 40 % Set Rate:  [14 bmp-20 bmp] 20 bmp Vt Set:  [500 mL-550 mL] 550 mL PEEP:  [5 cmH20] 5 cmH20 Pressure Support:  [5 cmH20-8 cmH20] 5 cmH20 Plateau Pressure:  [13 cmH20-22 cmH20] 16 cmH20  INTAKE /  OUTPUT: I/O last 3 completed shifts: In: 3748 [I.V.:1450; IV Piggyback:2298] Out: 262 [Urine:262]  PHYSICAL EXAMINATION: GEN: Intubated  HEENT: left pupil is large with irregular border (baseline per daughter), right pupil small and sluggish to light.  CV: tachycardia ,regular rhythm, no murmurs, rubs, or gallops PULM: intubated, overall clear with some crackles on the left lung base, no wheezing   ABD: Soft, nondistended, hypoactive bowel sounds  SKIN: No rash or cyanosis; warm and well-perfused EXTR: No lower extremity edema or calf tenderness;  thready DP pulses Neuro: rass -2 equivalent (last sedation was versed hours ago)  LABS: PULMONARY  Recent Labs Lab 06/30/16 0922 06/30/16 1710 06/30/16 1819 07/01/16 0308  PHART  --  7.148* 7.319* 7.382  PCO2ART  --  71.6* 44.5 37.4  PO2ART  --  393.0* 66.0* 271.0*  HCO3  --  24.8 22.9 22.2  TCO2 26 27 24 23   O2SAT  --  100.0 91.0 100.0    CBC  Recent Labs Lab 06/30/16 0850  06/30/16 1615 06/30/16 1837 07/01/16 0302  HGB 12.3*  < > 13.1 11.7* 11.5*  HCT 37.1*  < > 39.8 36.6* 35.9*  WBC 4.6  --  8.2  --  7.3  PLT 194  --  207  --  178  < > = values in this interval not displayed.  COAGULATION  Recent Labs Lab 06/30/16 0850  INR 1.05    CARDIAC   Recent Labs Lab 06/30/16 0850 06/30/16 1837 06/30/16 2337  TROPONINI <0.03 0.03* 0.03*   No results for input(s): PROBNP in the last 168 hours.   CHEMISTRY  Recent Labs Lab 06/30/16 0850 06/30/16 0922 06/30/16 1615 06/30/16 1837 07/01/16 0302  NA 141 143 141 144 144  K 5.5* 5.5* 4.6 4.3 4.6  CL 111 110 110 113* 115*  CO2 21*  --  22 21* 21*  GLUCOSE 183* 184* 188* 184* 125*  BUN 42* 62* 45* 45* 44*  CREATININE 1.66* 1.60* 1.74* 1.76* 1.72*  CALCIUM 8.4*  --  8.5* 8.1* 8.0*  MG  --   --   --   --  1.9  PHOS  --   --   --   --  3.6   Estimated Creatinine Clearance: 32.5 mL/min (by C-G formula based on SCr of 1.72 mg/dL (H)).   LIVER  Recent Labs Lab 06/30/16 0850 06/30/16 1615  AST 40 21  ALT 27 18  ALKPHOS 107 103  BILITOT 1.4* 0.3  PROT 6.1* 6.4*  ALBUMIN 3.1* 3.2*  INR 1.05  --      INFECTIOUS  Recent Labs Lab 06/30/16 0922 06/30/16 1645 06/30/16 1837 07/01/16 0302  LATICACIDVEN 2.84* 1.55 1.4  --   PROCALCITON  --   --  0.93 1.98     ENDOCRINE CBG (last 3)  No results for input(s): GLUCAP in the last 72 hours.       IMAGING x48h  - image(s) personally visualized  -   highlighted in bold Ct Head Wo Contrast  Result Date: 06/30/2016 CLINICAL DATA:  Lethargy  EXAM: CT HEAD WITHOUT CONTRAST TECHNIQUE: Contiguous axial images were obtained from the base of the skull through the vertex without intravenous contrast. COMPARISON:  01/29/2016 FINDINGS: BRAIN: Sulcal and ventricular prominence consistent with superficial and central atrophy. No intraparenchymal hemorrhage, mass effect nor midline shift. Moderate supratentorial white matter hypodensities consistent with chronic chronic small vessel ischemic disease. No acute large vascular territory infarcts. No abnormal extra-axial fluid collections. Basal cisterns are patent. VASCULAR: Moderate  calcific atherosclerosis of the carotid siphons. SKULL: No skull fracture. No significant scalp soft tissue swelling. SINUSES/ORBITS: The mastoid air-cells and included paranasal sinuses are well-aerated.The included ocular globes and orbital contents are non-suspicious. OTHER: None. IMPRESSION: Chronic microvascular ischemic disease of bilateral periventricular and right external capsule white matter superimposed on central and superficial atrophy. No acute large vessel territory infarction, extra-axial fluid collection or hemorrhage. Electronically Signed   By: Tollie Eth M.D.   On: 06/30/2016 21:20   Ct Abdomen Pelvis W Contrast  Result Date: 06/30/2016 CLINICAL DATA:  Diffuse abdominal pain, weakness, history of GI bleed EXAM: CT ABDOMEN AND PELVIS WITH CONTRAST TECHNIQUE: Multidetector CT imaging of the abdomen and pelvis was performed using the standard protocol following bolus administration of intravenous contrast. CONTRAST:  80mL ISOVUE-300 IOPAMIDOL (ISOVUE-300) INJECTION 61% COMPARISON:  CT abdomen pelvis of 01/29/2016 FINDINGS: Lower chest: There still opacity in the left lower lobe which could conceivably represent pleural parenchymal scarring, but pneumonia cannot be excluded. The right lung base is clear. Hepatobiliary: Several small scattered low-attenuation structures throughout the liver are most consistent with  hepatic cysts. No calcified gallstones are seen within the slightly distended gallbladder. Pancreas: The pancreas appears somewhat atrophic. The pancreatic duct is not dilated. Spleen: Spleen is unremarkable. Adrenals/Urinary Tract: The right adrenal gland is stable. A probable small left adrenal adenoma also appears stable. The kidneys enhance with no calculus or mass. There is somewhat diminished profusion noted within the lower poles of both kidneys. Some of this lower attenuation could be artifactual, due to the patient's arms lying beside him, but focal pyelonephritis cannot be excluded and clinical correlation is recommended. The ureters are normal in caliber. The urinary bladder is not well distended and appears therefore somewhat thick walled. Stomach/Bowel: The stomach is fluid distended with no abnormality evident. There is no change in minimally prominent small bowel loops compared to the prior CT. Possibly indicating mild ileus. There is some rectal distention with feces and there are scattered rectosigmoid colon diverticula present. The terminal ileum is unremarkable. The appendix is not definitely visualized. Vascular/Lymphatic: The abdominal aorta is normal in caliber with moderate abdominal aortic atherosclerosis present. Reproductive: The prostate is normal in size. Other: Fat does enter the right inguinal canal as noted previously. Musculoskeletal: There is degenerative change throughout the lumbar spine. No compression deformity is seen. Degenerative change also is noted in the hips. IMPRESSION: 1. Persistent opacity in the left lower lobe could be due to scarring, but pneumonia cannot be excluded. Correlate clinically. 2. Somewhat diminished perfusion of the lower poles of both kidneys may be artifactual as noted above, but focal pyelonephritis cannot be excluded. 3. Somewhat thick-walled urinary bladder. Cannot exclude inflammation as with cystitis. 4. Moderate abdominal aortic atherosclerosis.  5. Fat does enter the right inguinal canal. Electronically Signed   By: Dwyane Dee M.D.   On: 06/30/2016 11:03   Dg Chest Port 1 View  Result Date: 07/01/2016 CLINICAL DATA:  Respiratory failure. EXAM: PORTABLE CHEST 1 VIEW COMPARISON:  06/30/2016. FINDINGS: Endotracheal tube and NG tube in stable position. Heart size stable. Low lung volumes. Progressive left lower lobe infiltrate. Small left pleural effusion. No pneumothorax. IMPRESSION: 1. Lines and tubes in stable position. 2. Low lung volumes. Progressive left lower lobe infiltrate. Small left pleural effusion. Electronically Signed   By: Maisie Fus  Register   On: 07/01/2016 07:51   Dg Chest Portable 1 View  Result Date: 06/30/2016 CLINICAL DATA:  Acute hypoxic respiratory failure. Chronic kidney disease. Endotracheal tube  placement. EXAM: PORTABLE CHEST 1 VIEW COMPARISON:  Prior today FINDINGS: There has been interval placement of endotracheal tube and nasogastric tube in appropriate position. No evidence of pneumothorax. Opacity is again seen in the left retrocardiac lung base which may be due to atelectasis or infiltrate. Right lung is clear. Heart size is within normal limits. Aortic atherosclerosis. IMPRESSION: Endotracheal tube and nasogastric tube in appropriate position. Left lower lobe atelectasis versus infiltrate. Electronically Signed   By: Myles RosenthalJohn  Stahl M.D.   On: 06/30/2016 19:37   Dg Chest Portable 1 View  Result Date: 06/30/2016 CLINICAL DATA:  patient is more lethargic and weak today, unable to stand. Patient currently opens eyes spontaneously but is weak. Patient denies any complaints of pain. He is alert and oriented to self and place only. Recently in the hospital for pneumonia and GI bleed EXAM: PORTABLE CHEST - 1 VIEW COMPARISON:  01/29/2016 FINDINGS: Prominent perihilar interstitial markings left greater than right as before. No confluent airspace disease or overt edema. Heart size upper limits normal for technique.   Atheromatous aorta. No pneumothorax.  No effusion. Visualized bones unremarkable. IMPRESSION: 1. No acute disease. 2.  Aortic Atherosclerosis (ICD10-170.0) Electronically Signed   By: Corlis Leak  Hassell M.D.   On: 06/30/2016 10:09       LINES/TUBES: ETT 10/19 >> OG Tube 10/19 >> PIV x 2   DISCUSSION: Mr. Veto KempsRudd is a 82 year old male with PMH of DM, CKD, Glaucoma, hx og GIB in May 2017, possible history of paroxysmal Afib  initially presented to the ED with lethargy, was found to have a UTI, and later on developed acute respiratory distress requiring intubation in the ED. Respiratory distress likely secondary to aspiration event (PNA vs pneumonitis). Initial ABG with hypercarbic respiratory acidosis after intubation. He is also tachycardic with elevated BP with SBP in 200s possibly due to inadequate sedation after intubation. He was subsequently started on Fentanyl drip and his blood pressure did not tolerate (decreased to SBP 80s). He is getting 1L bolus currently.   ASSESSMENT / PLAN:  PULMONARY A: Acute Respiratory Distress: possibly contributed by aspiration PNA vs pneumonitis requiring intubation in ED (but cxr findings could be chronic(. Initial ABG with hypercarbic respiratory acidosis likely due to initial vent settings.  - meets extubation criteiral 07/01/2016   P:   Extubate  ? Reintubate if needed - grandaughter undecided  INFECTIOUS A:   UTI Concern for possible Aspiration PNA  S/p CTX in ED. Vancomycin and Zosyn for broad coverage for now   PLAN Anti-infectives    Start     Dose/Rate Route Frequency Ordered Stop   07/01/16 1800  vancomycin (VANCOCIN) IVPB 1000 mg/200 mL premix     1,000 mg 200 mL/hr over 60 Minutes Intravenous Every 24 hours 06/30/16 1809     07/01/16 0200  piperacillin-tazobactam (ZOSYN) IVPB 3.375 g     3.375 g 12.5 mL/hr over 240 Minutes Intravenous Every 8 hours 06/30/16 1809     06/30/16 1815  piperacillin-tazobactam (ZOSYN) IVPB 3.375 g     3.375  g 100 mL/hr over 30 Minutes Intravenous  Once 06/30/16 1809 06/30/16 1939   06/30/16 1815  vancomycin (VANCOCIN) 1,750 mg in sodium chloride 0.9 % 500 mL IVPB     1,750 mg 250 mL/hr over 120 Minutes Intravenous  Once 06/30/16 1809 06/30/16 2109   06/30/16 1300  cefTRIAXone (ROCEPHIN) 1 g in dextrose 5 % 50 mL IVPB     1 g 100 mL/hr over 30 Minutes Intravenous  Once 06/30/16 1258 06/30/16 1600         CARDIOVASCULAR A:  Tachycardia: sinus. Possibly dehydrated.  HTN initially, now hypotensive in the setting of initiation Fentanyl for sedation. BP improving with fluids Reported Chest Pain:  EKG with no signs of ischemic changes. Initial troponin negative Questionable history of paroxysmal Afib: per grand-daughter, no mention in initial chart review. Not on meds at home. WiIl chart review to obtain further information.  - likely stress troponin bump  P:  Fluids t 50cc Await echo  RENAL A:   CKD: Creatinine at baseline  Hyperkalemia, resolved: 5.5 in ED however hemolysis noted, repeat 4.6.  s/p calcium gluconate in ED.  Lactic Acidosis: 2.84 > 1.55. Unclear in etiology but has resolved with IVF in the ED    -rising creat, k ok on 07/01/2016   P:   Monitor     GASTROINTESTINAL A:   History of GI Bleed: per chart history family declined evaluation by GI in  01/2016 and decided on conservative management with PPI. No melena. But OG tube with coffee ground fluid. Initial hgb stable. PT/INR normal   07/01/2016 - no gi bleed  P:   Dc PPI drip -> chagne to ppi daily Repeat BMP    NEUROLOGIC A:   Lethargy: possibly due to infection/UTI and baseline status Normal ammonia level.   - drowsy but extubatable. Nil acuet on CT Head P:   RASS goal: 0 to -1 PRN Versed, PRN Fentanyl    HEMATOLOGIC A:   No acute issues P:  SCD for DVT prophylaxis due to concern for GI Bleed  Will consider heparin 07/02/16   ENDOCRINE A:   No issues currently  P:   Monitor glucose with  BMPs   FAMILY  - Updates:   - Inter-disciplinary family meet or Palliative Care meeting due by: 10/26 - see below      Interdisciplinary Goals of Care Family Meeting   Date carried out:: 07/01/2016  Location of the meeting: Bedside  Member's involved: Physician, Bedside Registered Nurse, Family Member or next of kin and Other: grandaughter the dpoa  Durable Power of Attorney or Environmental health practitioner: grandaughter    Discussion: We discussed goals of care for [employer] .  - patient is dNR but she thinks she might want reintubation. She is unsure and said to take it day by day  Code status: Limited Code or DNR with short term  Disposition: Continue current acute care  Time spent for the meeting: 10 min  Alexandera Kuntzman 07/01/2016, 2:24 PM    The patient is critically ill with multiple organ systems failure and requires high complexity decision making for assessment and support, frequent evaluation and titration of therapies, application of advanced monitoring technologies and extensive interpretation of multiple databases.   Critical Care Time devoted to patient care services described in this note is  30  Minutes. This time reflects time of care of this signee Dr Kalman Shan. This critical care time does not reflect procedure time, or teaching time or supervisory time of PA/NP/Med student/Med Resident etc but could involve care discussion time    Dr. Kalman Shan, M.D., [facility].C.P Pulmonary and Critical Care Medicine Staff Physician [facility] Pager: [phone], If no answer or between  15:00h - 7:00h: call [phone]  07/01/2016 2:25 PM

## 2016-07-01 NOTE — Progress Notes (Signed)
240mL Fentanyl wasted in sink witnessed by Sunny SchleinFelicia, Charity fundraiserN.   Roselie AwkwardMegan Dominiqua Cooner, RN

## 2016-07-02 LAB — URINE CULTURE
Culture: 40000 — AB
Culture: NO GROWTH

## 2016-07-02 LAB — BASIC METABOLIC PANEL
ANION GAP: 5 (ref 5–15)
BUN: 40 mg/dL — ABNORMAL HIGH (ref 6–20)
CALCIUM: 7.8 mg/dL — AB (ref 8.9–10.3)
CO2: 23 mmol/L (ref 22–32)
Chloride: 117 mmol/L — ABNORMAL HIGH (ref 101–111)
Creatinine, Ser: 1.7 mg/dL — ABNORMAL HIGH (ref 0.61–1.24)
GFR calc Af Amer: 39 mL/min — ABNORMAL LOW (ref >60)
GFR calc non Af Amer: 33 mL/min — ABNORMAL LOW (ref >60)
GLUCOSE: 85 mg/dL (ref 65–99)
Potassium: 4 mmol/L (ref 3.5–5.1)
Sodium: 145 mmol/L (ref 135–145)

## 2016-07-02 LAB — CBC WITH DIFFERENTIAL/PLATELET
BASOS PCT: 0 %
Basophils Absolute: 0 10*3/uL (ref 0.0–0.1)
EOS PCT: 2 %
Eosinophils Absolute: 0.2 10*3/uL (ref 0.0–0.7)
HEMATOCRIT: 29.6 % — AB (ref 39.0–52.0)
Hemoglobin: 9.6 g/dL — ABNORMAL LOW (ref 13.0–17.0)
LYMPHS ABS: 0.9 10*3/uL (ref 0.7–4.0)
Lymphocytes Relative: 10 %
MCH: 28.3 pg (ref 26.0–34.0)
MCHC: 32.4 g/dL (ref 30.0–36.0)
MCV: 87.3 fL (ref 78.0–100.0)
MONO ABS: 0.4 10*3/uL (ref 0.1–1.0)
Monocytes Relative: 5 %
NEUTROS ABS: 7.2 10*3/uL (ref 1.7–7.7)
Neutrophils Relative %: 83 %
Platelets: 148 10*3/uL — ABNORMAL LOW (ref 150–400)
RBC: 3.39 MIL/uL — ABNORMAL LOW (ref 4.22–5.81)
RDW: 17.5 % — AB (ref 11.5–15.5)
WBC: 8.7 10*3/uL (ref 4.0–10.5)

## 2016-07-02 LAB — PHOSPHORUS: Phosphorus: 2.9 mg/dL (ref 2.5–4.6)

## 2016-07-02 LAB — GLUCOSE, CAPILLARY: GLUCOSE-CAPILLARY: 127 mg/dL — AB (ref 65–99)

## 2016-07-02 LAB — MAGNESIUM: Magnesium: 1.9 mg/dL (ref 1.7–2.4)

## 2016-07-02 LAB — PROCALCITONIN: Procalcitonin: 1.81 ng/mL

## 2016-07-02 MED ORDER — SODIUM CHLORIDE 0.9 % IV SOLN
3.0000 g | Freq: Three times a day (TID) | INTRAVENOUS | Status: DC
  Administered 2016-07-02 – 2016-07-04 (×7): 3 g via INTRAVENOUS
  Filled 2016-07-02 (×9): qty 3

## 2016-07-02 MED ORDER — FOOD THICKENER (SIMPLYTHICK)
2.0000 | Freq: Three times a day (TID) | ORAL | Status: DC
  Administered 2016-07-02 – 2016-07-04 (×8): 2 via ORAL
  Filled 2016-07-02 (×13): qty 2

## 2016-07-02 NOTE — Progress Notes (Signed)
Pharmacy Antibiotic Note  Francisco Roberts is a 82 y.o. male admitted on 06/30/2016 with sepsis.  Pharmacy has been consulted for zosyn and vancomycin dosing. Patient with dark emesis and PMH of GI bleed. Protonix infusion initiated.   Pharmacy is consulted to switch vancomycin to unasyn for aspiration pneumonia.  Plan: Unasyn 3g IV q8h Monitor culture data, renal function and clinical course  Height: 6\' 2"  (188 cm) Weight: 185 lb 3 oz (84 kg) IBW/kg (Calculated) : 82.2  Temp (24hrs), Avg:98.2 F (36.8 C), Min:96.8 F (36 C), Max:99.5 F (37.5 C)   Recent Labs Lab 06/30/16 0850 06/30/16 0922 06/30/16 1615 06/30/16 1645 06/30/16 1837 07/01/16 0302 07/02/16 0217  WBC 4.6  --  8.2  --   --  7.3 8.7  CREATININE 1.66* 1.60* 1.74*  --  1.76* 1.72* 1.70*  LATICACIDVEN  --  2.84*  --  1.55 1.4  --   --     Estimated Creatinine Clearance: 32.9 mL/min (by C-G formula based on SCr of 1.7 mg/dL (H)).    No Known Allergies  Arlean Hoppingorey M. Newman PiesBall, PharmD, BCPS Clinical Pharmacist Pager [phone] 07/02/2016 1:08 PM

## 2016-07-02 NOTE — Evaluation (Signed)
Clinical/Bedside Swallow Evaluation Patient Details  Name: Francisco Roberts MRN: [id number] Date of Birth: 1922-01-27  Today's Date: 07/02/2016 Time: SLP Start Time (ACUTE ONLY): 0940 SLP Stop Time (ACUTE ONLY): 1005 SLP Time Calculation (min) (ACUTE ONLY): 25 min  Past Medical History:  Past Medical History:  Diagnosis Date  . Diabetes mellitus without complication (HCC)   . Glaucoma    Past Surgical History: No past surgical history on file. HPI:  82 year old male admitted 06/30/16 due to lethargy. PMH significant for DM, CKD, glaucoma, GI bleed, AFib, HOH. CXR indicates LLL infiltrate. Pt intubated 10/19-20-17. Orders received for BSE to evaluate swallow function and safety.    Assessment / Plan / Recommendation Clinical Impression   Pt presents with mild oral deficits, characterized mainly by being edentulous. Swallow reflex appears slightly delayed, but with adequate elevation per palpation. Cough response noted following trial of thin liquid, otherwise no overt s/s aspiration of any other consistency. Granddaughter (primary caregiver) was present, and indicated pt has been on a finely chopped (dys 2) diet and honey thick liquids since May. Per granddaughter, no objective assessment was completed at that time, and that home trials of thinner liquids since then have resulted in coughing/choking. At this time, recommend resuming dys 2 diet and honey thick liquids. crushed meds. [facility] and reviewed with granddaughter and RN.   ST will follow for assessment of diet tolerance, and to determine appropriateness of completing objective study to determine least restrictive diet. Granddaughter did not seem interested in pursuing MBS, as she indicates pt appears to be tolerating the diet she has been giving him without difficulty. Will follow.     Aspiration Risk  Mild aspiration risk    Diet Recommendation Dysphagia 2 (Fine chop);Honey-thick liquid   Liquid  Administration via: Cup Medication Administration: Crushed with puree Supervision: Staff to assist with self feeding;Full supervision/cueing for compensatory strategies Compensations: Slow rate;Small sips/bites;Minimize environmental distractions Postural Changes: Seated upright at 90 degrees;Remain upright for at least 30 minutes after po intake    Other  Recommendations Oral Care Recommendations: Oral care BID;Staff/trained caregiver to provide oral care   Follow up Recommendations  (TBD)      Frequency and Duration min 1 x/week  2 weeks       Prognosis Prognosis for Safe Diet Advancement: Fair      Swallow Study   General Date of Onset: 06/30/16 HPI: 82 year old male admitted 06/30/16 due to lethargy. PMH significant for DM, CKD, glaucoma, GI bleed, AFib, HOH. CXR indicates LLL infiltrate. Pt intubated 10/19-20-17. Orders received for BSE to evaluate swallow function and safety.  Type of Study: Bedside Swallow Evaluation Previous Swallow Assessment: BSE Jan 30, 2016 with recommendation for Dys 1/HTL, advanced to Dys 1/thin 02/01/16.  Diet Prior to this Study: NPO Temperature Spikes Noted: No Respiratory Status: Nasal cannula History of Recent Intubation: Yes Length of Intubations (days): 2 days Date extubated: 07/01/16 Behavior/Cognition: Alert;Cooperative;Requires cueing Oral Cavity Assessment: Within Functional Limits Oral Care Completed by SLP: Recent completion by staff Oral Cavity - Dentition: Edentulous (except one tooth) Self-Feeding Abilities: Total assist Patient Positioning: Upright in bed Baseline Vocal Quality: Low vocal intensity Volitional Cough: Cognitively unable to elicit Volitional Swallow: Unable to elicit    Oral/Motor/Sensory Function Overall Oral Motor/Sensory Function: Within functional limits   Ice Chips Ice chips: Within functional limits Presentation: Spoon   Thin Liquid Thin Liquid: Impaired Presentation: Straw Pharyngeal  Phase Impairments:  Cough - Immediate;Suspected delayed Swallow  Nectar Thick Nectar Thick Liquid: Not tested   Honey Thick Honey Thick Liquid: Not tested   Puree Puree: Within functional limits Presentation: Spoon   Solid    Solid: Within functional limits Other Comments: soft solid, graham cracker soaked in applesauce       Sloan Takagi, Ruffin Pyo 07/02/2016,10:38 AM  Merlene Laughter B. Kylee Nardozzi, MSP, CCC-SLP [phone]

## 2016-07-02 NOTE — Evaluation (Signed)
Physical Therapy Evaluation Patient Details Name: Francisco Roberts MRN: [id number] DOB: 02/09/1922 Today's Date: 07/02/2016   History of Present Illness  82 year old male admitted 06/30/16 due to lethargy. PMH significant for DM, CKD, glaucoma, GI bleed, AFib, HOH. CXR indicates LLL infiltrate. Pt intubated 10/19-20-17.   Clinical Impression  Pt admitted with above diagnosis. Pt currently with functional limitations due to the deficits listed below (see PT Problem List). Pt was able to ambulate but with mod to max assist of 2 persons.  Granddaughter present and she states that he needs quite a bit of help at home but that she can take care of him.  She cares for he and grandmother.  Will continue to follow acutely.  Pt will benefit from skilled PT to increase their independence and safety with mobility to allow discharge to the venue listed below.      Follow Up Recommendations Home health PT;Supervision/Assistance - 24 hour (HHAide, HHOT)    Equipment Recommendations  None recommended by PT    Recommendations for Other Services OT consult     Precautions / Restrictions Precautions Precautions: Fall Restrictions Weight Bearing Restrictions: No      Mobility  Bed Mobility Overal bed mobility: Needs Assistance;+2 for physical assistance Bed Mobility: Supine to Sit     Supine to sit: Max assist;+2 for physical assistance     General bed mobility comments: Pt needed assist to LEs with pt resistive to moving LEs. Needed assist for elevation of trunk as well.   Transfers Overall transfer level: Needs assistance Equipment used: Rolling walker (2 wheeled) Transfers: Sit to/from [employer] Sit to Stand: Mod assist;+2 physical assistance;Max assist;From elevated surface Stand pivot transfers: Mod assist;+2 physical assistance;From elevated surface       General transfer comment: Pt needs cues and physical assist to place hands and feet prior to standing attempts.  Pt  needed assist to power up. Pt needs constant cues to maintain upright posture as he tends to lean left and has very poor balance reactions.    Ambulation/Gait Ambulation/Gait assistance: Mod assist;Max assist;+2 physical assistance Ambulation Distance (Feet): 14 Feet Assistive device: Rolling walker (2 wheeled) Gait Pattern/deviations: Step-to pattern;Decreased step length - right;Decreased step length - left;Steppage;Leaning posteriorly;Staggering left;Staggering right;Narrow base of support;Trunk flexed   Gait velocity interpretation: Below normal speed for age/gender General Gait Details: Pt with overall flexed trunk, hips and kness.  Maintains narrow BOS that makes ambulation difficult.  Granddaughter states that pt needs max cues and min assist normally at home and that the more you hold pt the worse he will balance. Pt needed close to max assist at times for ambulation and at times needs min assist.  Very variable gait.  Pt has very poor balance reactions as he will get overbalanced easily leaning too far to left and posteriorly.  Overall, decr safety awareness and poor balance.  Had to hold pts hands on RW as well as he has the hand so flexed in grip that he can't really grip RW effectively.   Stairs            Wheelchair Mobility    Modified Rankin (Stroke Patients Only)       Balance Overall balance assessment: Needs assistance;History of Falls Sitting-balance support: Bilateral upper extremity supported;Feet supported Sitting balance-Leahy Scale: Poor Sitting balance - Comments: Needed min assist and cues due to posterior lean and hands and elbows flexed therefore not using hands/arms effectively.  Postural control: Posterior lean Standing balance support: Bilateral  upper extremity supported;During functional activity Standing balance-Leahy Scale: Poor Standing balance comment: Pt needed max to mod assist to stand with RW with constant cues with RW assist.                               Pertinent Vitals/Pain Pain Assessment: No/denies pain  100% on RA.  Removed O2 and nurse agreed.  Other VSS.     Home Living Family/patient expects to be discharged to:: Private residence Living Arrangements: Other relatives (granddaughter, grandson in Social workerlaw and great grandson) Available Help at Discharge: Family;Available 24 hours/day Type of Home: House Home Access: Stairs to enter Entrance Stairs-Rails: None Entrance Stairs-Number of Steps: 2 Home Layout: One level Home Equipment: Walker - 2 wheels;Bedside commode;Shower seat;Hospital bed;Tub bench;Wheelchair - manual;Hand held shower head (2 3N1's, ) Additional Comments: granddaughter states that they bump pt up and down steps in wheelchair    Prior Function Level of Independence: Needs assistance   Gait / Transfers Assistance Needed: Walks short distances only with RW and one person A.  Family does A with coming to standing.    ADL's / Homemaking Assistance Needed: Family  A with all ADLs.          Hand Dominance        Extremity/Trunk Assessment   Upper Extremity Assessment: Defer to OT evaluation           Lower Extremity Assessment: RLE deficits/detail;LLE deficits/detail RLE Deficits / Details: significant rigidity and pt would not follow commands to move LEs LLE Deficits / Details: significant rigidity and pt will not move LEs to command  Cervical / Trunk Assessment: Kyphotic  Communication   Communication: HOH  Cognition Arousal/Alertness: Awake/alert Behavior During Therapy: Flat affect Overall Cognitive Status: Within Functional Limits for tasks assessed                      General Comments General comments (skin integrity, edema, etc.): Pt needed to have BM therefore got pt on 3N1.  total assist to clean pt    Exercises     Assessment/Plan    PT Assessment Patient needs continued PT services  PT Problem List Decreased strength;Decreased activity  tolerance;Decreased balance;Decreased mobility;Decreased knowledge of use of DME;Decreased safety awareness;Decreased knowledge of precautions;Decreased coordination          PT Treatment Interventions DME instruction;Gait training;Functional mobility training;Therapeutic activities;Therapeutic exercise;Balance training;Patient/family education    PT Goals (Current goals can be found in the Care Plan section)  Acute Rehab PT Goals Patient Stated Goal: to get better PT Goal Formulation: With patient Time For Goal Achievement: 07/16/16 Potential to Achieve Goals: Good    Frequency Min 3X/week   Barriers to discharge        Co-evaluation               End of Session Equipment Utilized During Treatment: Gait belt;Oxygen Activity Tolerance: Patient limited by fatigue Patient left: in chair;with call bell/phone within reach;with family/visitor present Nurse Communication: Mobility status         Time: 1015-1050 PT Time Calculation (min) (ACUTE ONLY): 35 min   Charges:   PT Evaluation $PT Eval Moderate Complexity: 1 Procedure PT Treatments $Gait Training: 8-22 mins   PT G CodesTawni Millers:        Henslee Lottman F 07/02/2016, 11:41 AM Junius Faucett,PT Acute Rehabilitation [phone] (pager)

## 2016-07-02 NOTE — H&P (Addendum)
PULMONARY / CRITICAL CARE MEDICINE   Name: Francisco Roberts MRN: [id number] DOB: Oct 07, 1921    ADMISSION DATE:  06/30/2016 CONSULTATION DATE:  06/30/16  REFERRING MD:  ED Dr. Judithann Roberts   CHIEF COMPLAINT:  Lethargy  BRIEF Francisco Roberts is a 82 year old male with PMH of DM, CKD, Glaucoma, hx og GIB in May 2017, possible history of paroxysmal Afib  initially presented to the ED with lethargy. Granddaughter provided history. She reports he was starting to get a cold yesterday with some productive cough which seemed to worsen today. Today patient reported he had right sided flank/back pain and chest pain . Therefore his grand-daughter called EMS. In the ED, he was worked up with general labs, CT abdomen/pelvis which showed a persistent opacity in LLL which could be scarring but PNA cannot be excluded and cystitis. UA was significant for moderate leukocytes.  He was waiting to be transferred to a floor bed when he started having acute respiratory distress with sob with accessory muscle use, gurgling which did not improve with suction, HR in 160s, tachypena to 30s and therefore he was intubated. There was some concern for possible aspiration. He is only on Protonix and colace at home per daughter. She denies patient having dark tarry stools. No emesis at home.   At baseline patient needs assistance with all ADLs. Uses wheelchair and walker.   Other studies obtained in the ED: CXR with no acute process. PT /INR wnl. Trop < 0.03. EKG with NSR, tachycardia, old RBBB. K 5.5 although noted of hemolysis. He was given calcium gluconate for K. He was also given 2 L bolus. Received CTX x 1.   While in the room, patient's HR would intermittently increase to 160s-u pto 170s and then decrease on its own. BP is elevated as well. His OG tube with brown/coffee ground like fluid.    He  has a past medical history of Diabetes mellitus without complication (HCC) and Glaucoma.   He  has no past surgical history on file.      STUDIES:  CT abdomen/Pelvis 10/19: Persistent opacity in the left lower lobe could be due to scarring, but pneumonia cannot be excluded. Somewhat diminished perfusion of the lower poles of both kidneys may be artifactual as noted above, but focal pyelonephritis cannot be excluded. Somewhat thick-walled urinary bladder. Cannot exclude inflammation as with cystitis.  CXR 10/19: no acute disease  CULTURES: Blood culture 10/19 >> Tracheal Aspirate 10/19 >> Urine culture 10/19 >>  ANTIBIOTICS: S/p CTZ x 1 in ED Vanc 10/19 >> Zosyn 10/19 >>  SIGNIFICANT EVENTS: 10/19: admit to ICU for acute respiratory distress with possible aspiration PNA/penumonitis    07/01/2016  - meets extubation criteira though somewhat sleepy. RN and grandaughter say he is takng a nap and is not on sedation. Family wishes fore xtubation n ow. Not sure about reintubation goals   SUBJECTIVE/OVERNIGHT/INTERVAL HX 07/02/16 - remains extubated. Sitting in chair. Grandaughter feels he is improved. AKI better    VITAL SIGNS: BP (!) 106/53   Pulse 79   Temp 97.2 F (36.2 C)   Resp 16   Ht 6\' 2"  (1.88 m)   Wt 84 kg (185 lb 3 oz)   SpO2 100%   BMI 23.78 kg/m   HEMODYNAMICS:    VENTILATOR SETTINGS:    INTAKE / OUTPUT: I/O last 3 completed shifts: In: 3713.8 [I.V.:3313.8; IV Piggyback:400] Out: 1322 [Urine:1322]  PHYSICAL EXAMINATION: WUJ:[id number] elderly male. Sitting in chair in icu HEENT: left pupil is large  with irregular border (baseline per daughter), right pupil small and sluggish to light.  CV: tachycardia ,regular rhythm, no murmurs, rubs, or gallops PULM: intubated, overall clear with some crackles on the left lung base, no wheezing   ABD: Soft, nondistended, hypoactive bowel sounds  SKIN: No rash or cyanosis; warm and well-perfused EXTR: No lower extremity edema or calf tenderness; thready DP pulses Neuro:sleey - seems baseline. Resting on chair  LABS: PULMONARY  Recent  Labs Lab 06/30/16 0922 06/30/16 1710 06/30/16 1819 07/01/16 0308  PHART  --  7.148* 7.319* 7.382  PCO2ART  --  71.6* 44.5 37.4  PO2ART  --  393.0* 66.0* 271.0*  HCO3  --  24.8 22.9 22.2  TCO2 26 27 24 23   O2SAT  --  100.0 91.0 100.0    CBC  Recent Labs Lab 06/30/16 1615 06/30/16 1837 07/01/16 0302 07/02/16 0217  HGB 13.1 11.7* 11.5* 9.6*  HCT 39.8 36.6* 35.9* 29.6*  WBC 8.2  --  7.3 8.7  PLT 207  --  178 148*    COAGULATION  Recent Labs Lab 06/30/16 0850  INR 1.05    CARDIAC    Recent Labs Lab 06/30/16 0850 06/30/16 1837 06/30/16 2337  TROPONINI <0.03 0.03* 0.03*   No results for input(s): PROBNP in the last 168 hours.   CHEMISTRY  Recent Labs Lab 06/30/16 0850 06/30/16 0922 06/30/16 1615 06/30/16 1837 07/01/16 0302 07/02/16 0217  NA 141 143 141 144 144 145  K 5.5* 5.5* 4.6 4.3 4.6 4.0  CL 111 110 110 113* 115* 117*  CO2 21*  --  22 21* 21* 23  GLUCOSE 183* 184* 188* 184* 125* 85  BUN 42* 62* 45* 45* 44* 40*  CREATININE 1.66* 1.60* 1.74* 1.76* 1.72* 1.70*  CALCIUM 8.4*  --  8.5* 8.1* 8.0* 7.8*  MG  --   --   --   --  1.9 1.9  PHOS  --   --   --   --  3.6 2.9   Estimated Creatinine Clearance: 32.9 mL/min (by C-G formula based on SCr of 1.7 mg/dL (H)).   LIVER  Recent Labs Lab 06/30/16 0850 06/30/16 1615  AST 40 21  ALT 27 18  ALKPHOS 107 103  BILITOT 1.4* 0.3  PROT 6.1* 6.4*  ALBUMIN 3.1* 3.2*  INR 1.05  --      INFECTIOUS  Recent Labs Lab 06/30/16 0922 06/30/16 1645 06/30/16 1837 07/01/16 0302 07/02/16 0217  LATICACIDVEN 2.84* 1.55 1.4  --   --   PROCALCITON  --   --  0.93 1.98 1.81     ENDOCRINE CBG (last 3)  No results for input(s): GLUCAP in the last 72 hours.       IMAGING x48h  - image(s) personally visualized  -   highlighted in bold Ct Head Wo Contrast  Result Date: 06/30/2016 CLINICAL DATA:  Lethargy EXAM: CT HEAD WITHOUT CONTRAST TECHNIQUE: Contiguous axial images were obtained from the base  of the skull through the vertex without intravenous contrast. COMPARISON:  01/29/2016 FINDINGS: BRAIN: Sulcal and ventricular prominence consistent with superficial and central atrophy. No intraparenchymal hemorrhage, mass effect nor midline shift. Moderate supratentorial white matter hypodensities consistent with chronic chronic small vessel ischemic disease. No acute large vascular territory infarcts. No abnormal extra-axial fluid collections. Basal cisterns are patent. VASCULAR: Moderate calcific atherosclerosis of the carotid siphons. SKULL: No skull fracture. No significant scalp soft tissue swelling. SINUSES/ORBITS: The mastoid air-cells and included paranasal sinuses are well-aerated.The included ocular globes and  orbital contents are non-suspicious. OTHER: None. IMPRESSION: Chronic microvascular ischemic disease of bilateral periventricular and right external capsule white matter superimposed on central and superficial atrophy. No acute large vessel territory infarction, extra-axial fluid collection or hemorrhage. Electronically Signed   By: Tollie Eth M.D.   On: 06/30/2016 21:20   Dg Chest Port 1 View  Result Date: 07/01/2016 CLINICAL DATA:  Respiratory failure. EXAM: PORTABLE CHEST 1 VIEW COMPARISON:  06/30/2016. FINDINGS: Endotracheal tube and NG tube in stable position. Heart size stable. Low lung volumes. Progressive left lower lobe infiltrate. Small left pleural effusion. No pneumothorax. IMPRESSION: 1. Lines and tubes in stable position. 2. Low lung volumes. Progressive left lower lobe infiltrate. Small left pleural effusion. Electronically Signed   By: Maisie Fus  Register   On: 07/01/2016 07:51   Dg Chest Portable 1 View  Result Date: 06/30/2016 CLINICAL DATA:  Acute hypoxic respiratory failure. Chronic kidney disease. Endotracheal tube placement. EXAM: PORTABLE CHEST 1 VIEW COMPARISON:  Prior today FINDINGS: There has been interval placement of endotracheal tube and nasogastric tube in  appropriate position. No evidence of pneumothorax. Opacity is again seen in the left retrocardiac lung base which may be due to atelectasis or infiltrate. Right lung is clear. Heart size is within normal limits. Aortic atherosclerosis. IMPRESSION: Endotracheal tube and nasogastric tube in appropriate position. Left lower lobe atelectasis versus infiltrate. Electronically Signed   By: Myles Rosenthal M.D.   On: 06/30/2016 19:37       LINES/TUBES: ETT 10/19 >>07/01/16 OG Tube 10/19 >> PIV x 2   DISCUSSION: Mr. Landin is a 82 year old male with PMH of DM, CKD, Glaucoma, hx og GIB in May 2017, possible history of paroxysmal Afib  initially presented to the ED with lethargy, was found to have a UTI, and later on developed acute respiratory distress requiring intubation in the ED. Respiratory distress likely secondary to aspiration event (PNA vs pneumonitis). Initial ABG with hypercarbic respiratory acidosis after intubation. He is also tachycardic with elevated BP with SBP in 200s possibly due to inadequate sedation after intubation. He was subsequently started on Fentanyl drip and his blood pressure did not tolerate (decreased to SBP 80s). He is getting 1L bolus currently.   ASSESSMENT / PLAN:  PULMONARY A: Acute Respiratory Distress: possibly contributed by aspiration PNA vs pneumonitis requiring intubation in ED (but cxr findings could be chronic(. Initial ABG with hypercarbic respiratory acidosis likely due to initial vent settings.  - s/p extubation 07/01/16    P:   pulm toilet  o2 as needed   INFECTIOUS A:   UTI Concern for possible Aspiration PNA  S/p CTX in ED. Vancomycin and Zosyn for broad coverage for now   PLAN chagne zosyn to unasyn  - triad to decide on aabx stop Dc vanc      CARDIOVASCULAR A:  Tachycardia: sinus. Possibly dehydrated.  HTN initially, now hypotensive in the setting of initiation Fentanyl for sedation. BP improving with fluids Reported Chest Pain:  EKG  with no signs of ischemic changes. Initial troponin negative Questionable history of paroxysmal Afib: per grand-daughter, no mention in initial chart review. Not on meds at home. WiIl chart review to obtain further information.  - likely stress troponin bump  P:  Fluids t 50cc Await echo  RENAL A:   CKD: Creatinine at baseline  Hyperkalemia, resolved: 5.5 in ED however hemolysis noted, repeat 4.6.  s/p calcium gluconate in ED.  Lactic Acidosis: 2.84 > 1.55. Unclear in etiology but has resolved with IVF  in the ED    -rising creat, k ok on 07/02/2016   P:   Monitor     GASTROINTESTINAL A:   History of GI Bleed: per chart history family declined evaluation by GI in  01/2016 and decided on conservative management with PPI. No melena. But OG tube with coffee ground fluid. Initial hgb stable. PT/INR normal   07/02/2016 - no gi bleed  P:   ppi daily Repeat BMP    NEUROLOGIC A:   Lethargy: possibly due to infection/UTI and baseline status Normal ammonia level.   - drowsy - ? baseline Nil acuet on CT Head P:   RASS goal: 0 to -1 PRN Versed, PRN Fentanyl    HEMATOLOGIC A:   No acute issues P:  SCD for DVT prophylaxis due to concern for GI Bleed  Will consider heparin 07/03/16   ENDOCRINE A:   No issues currently  P:   Monitor glucose with BMPs   FAMILY  - Updates: grandaugther updated.   - Inter-disciplinary family meet or Palliative Care meeting due by: 10/26 - see below    GLOBAL Move to med surg. PCCM off. [facility] primary from 07/03/16 and d.w Dr Joseph Art    Dr. Kalman Shan, M.D., [facility].C.P Pulmonary and Critical Care Medicine Staff Physician [facility] Pager: [phone], If no answer or between  15:00h - 7:00h: call [phone]  07/02/2016 12:56 PM

## 2016-07-03 DIAGNOSIS — Z794 Long term (current) use of insulin: Secondary | ICD-10-CM

## 2016-07-03 DIAGNOSIS — B957 Other staphylococcus as the cause of diseases classified elsewhere: Secondary | ICD-10-CM

## 2016-07-03 DIAGNOSIS — Z1629 Resistance to other single specified antibiotic: Secondary | ICD-10-CM

## 2016-07-03 DIAGNOSIS — N183 Chronic kidney disease, stage 3 (moderate): Secondary | ICD-10-CM

## 2016-07-03 DIAGNOSIS — Z8719 Personal history of other diseases of the digestive system: Secondary | ICD-10-CM

## 2016-07-03 DIAGNOSIS — E1122 Type 2 diabetes mellitus with diabetic chronic kidney disease: Secondary | ICD-10-CM

## 2016-07-03 DIAGNOSIS — D649 Anemia, unspecified: Secondary | ICD-10-CM

## 2016-07-03 DIAGNOSIS — R918 Other nonspecific abnormal finding of lung field: Secondary | ICD-10-CM

## 2016-07-03 LAB — CBC WITH DIFFERENTIAL/PLATELET
BASOS ABS: 0 10*3/uL (ref 0.0–0.1)
Basophils Relative: 0 %
EOS ABS: 0.2 10*3/uL (ref 0.0–0.7)
EOS PCT: 2 %
HCT: 31.1 % — ABNORMAL LOW (ref 39.0–52.0)
Hemoglobin: 10.2 g/dL — ABNORMAL LOW (ref 13.0–17.0)
LYMPHS PCT: 12 %
Lymphs Abs: 1 10*3/uL (ref 0.7–4.0)
MCH: 28.6 pg (ref 26.0–34.0)
MCHC: 32.8 g/dL (ref 30.0–36.0)
MCV: 87.1 fL (ref 78.0–100.0)
MONO ABS: 0.5 10*3/uL (ref 0.1–1.0)
Monocytes Relative: 5 %
Neutro Abs: 6.9 10*3/uL (ref 1.7–7.7)
Neutrophils Relative %: 81 %
PLATELETS: 181 10*3/uL (ref 150–400)
RBC: 3.57 MIL/uL — ABNORMAL LOW (ref 4.22–5.81)
RDW: 17.5 % — AB (ref 11.5–15.5)
WBC: 8.5 10*3/uL (ref 4.0–10.5)

## 2016-07-03 LAB — GLUCOSE, CAPILLARY: GLUCOSE-CAPILLARY: 94 mg/dL (ref 65–99)

## 2016-07-03 LAB — MAGNESIUM: Magnesium: 2.2 mg/dL (ref 1.7–2.4)

## 2016-07-03 LAB — PHOSPHORUS: Phosphorus: 2.1 mg/dL — ABNORMAL LOW (ref 2.5–4.6)

## 2016-07-03 MED ORDER — POTASSIUM & SODIUM PHOSPHATES 280-160-250 MG PO PACK
1.0000 | PACK | Freq: Once | ORAL | Status: AC
  Administered 2016-07-03: 1 via ORAL
  Filled 2016-07-03: qty 1

## 2016-07-03 MED ORDER — INSULIN ASPART 100 UNIT/ML ~~LOC~~ SOLN
0.0000 [IU] | Freq: Three times a day (TID) | SUBCUTANEOUS | Status: DC
  Administered 2016-07-03 – 2016-07-04 (×3): 1 [IU] via SUBCUTANEOUS

## 2016-07-03 NOTE — Progress Notes (Signed)
Subjective:  Interval history: Francisco Roberts was initially evaluated by IMTS for admission on 10/19 for lethargy and possible UTI. He was lethargic but answering simple questions at that time, able to protect his airway and maintaining saturations. After being evaluated, we were paged by the ED stating that he was dropping saturations, not able to clear or protect his airway with respiratory distress, sob and accessory muscle use. No improvement following suction. He became tachycardic, tachypneic and was intubated and went to the ICU.  While in the ICU, there was concern for aspiration PNA vs pneumonitis causing his acute respiratory distress. Treated with Vanc and Zosyn x2 days and changed to unasyn yesterday. He was quickly weaned off the ventilator and extubated on 10/20. Transferred out of the unit on 10/21 and we were asked to resume care today.   Today, Francisco Roberts is much more alert and awake today. He is able to answer questions appropriately, however is still somewhat confused. Has no complaints today. Resting comfortably in bed.   Objective:  Vital signs in last 24 hours: Vitals:   07/02/16 2014 07/02/16 2056 07/03/16 0516 07/03/16 0925  BP:  (!) 144/68 125/65   Pulse:  (!) 58 (!) 56   Resp:  17 17   Temp:  98.5 F (36.9 C) 98.4 F (36.9 C)   TempSrc:  Oral Oral   SpO2: 97% 91% 97% 96%  Weight:   194 lb 0.1 oz (88 kg)   Height:       GENERAL- alert, co-operative, appears as stated age, not in any distress. CARDIAC- RRR, no murmurs, rubs or gallops. RESP- Moving equal volumes of air, coarse breath sounds bilaterally with some faint crackles at bilateral lung bases ABDOMEN- Soft, nontender, bowel sounds present. NEURO- Alert and oriented to person only EXTREMITIES- pulse 2+, symmetric, no pedal edema. SKIN- Warm, dry, No rash or lesion.  Assessment/Plan:  Acute Hypoxic Respiratory Distress Likely secondary to aspiration event. Has dysphagia at baseline. Did have upper  respiratory gurgling at the time of the event. Intubated on 10/19 and extubated 10/20. Received 2 days of vanc/zosyn and was narrowed to Unasyn yesterday. Mental status improved today but only alert and oriented to person. Blood cultures with NGTD. CXR from 10/20 showing progressive left lower lobe infiltrate. Likely represents aspiration PNA vs aspiration pneumonitis. Covering with Unasyn. Never had any leukocytosis. Afebrile.  -Continue Unasyn, if continues to improve can switch to PO Augmentin tomorrow and complete 7 day course -If worsening, will repeat CXR -PT rec home health PT  UTI Urine culture growing 40,000 colonies of coag negative staph. Sensitivities back, only resistant to clindamycin. Sensitive to oxacillin. Will cover with Unasyn.   DM Type 2 CBGs qac qhs SSI-s  Chronic Kidney disease Stage III Baseline Cr 1.8.  Cr has been stable at 1.7 the past 3 days.   Anemia Hgb 10.2 today. Baseline appears to be 11-12. No acute blood loss. Suspect anemia of chronic disease secondary to CKD although does have a history of GI bleed earlier this year. No melanic stools noted or hematochezia. Will continue to monitor defer further work up as an outpatient.   Hypophosphatemia Phos 2.1 today, repleting  DVT PPx SCDs  Code Status After discussions with the Granddaughter, they would like to intubate if needed but would not want chest compressions, defibrillation, etc. If patient were to go into cardiac arrest.    Dispo: Anticipated discharge in approximately 1 day(s).   Valentino Nose, MD 07/03/2016, 2:16 PM Pager: [phone]

## 2016-07-04 DIAGNOSIS — L8990 Pressure ulcer of unspecified site, unspecified stage: Secondary | ICD-10-CM | POA: Insufficient documentation

## 2016-07-04 LAB — CBC WITH DIFFERENTIAL/PLATELET
BASOS ABS: 0 10*3/uL (ref 0.0–0.1)
BASOS PCT: 0 %
EOS PCT: 2 %
Eosinophils Absolute: 0.1 10*3/uL (ref 0.0–0.7)
HEMATOCRIT: 31.2 % — AB (ref 39.0–52.0)
Hemoglobin: 10.1 g/dL — ABNORMAL LOW (ref 13.0–17.0)
Lymphocytes Relative: 16 %
Lymphs Abs: 1.1 10*3/uL (ref 0.7–4.0)
MCH: 28.3 pg (ref 26.0–34.0)
MCHC: 32.4 g/dL (ref 30.0–36.0)
MCV: 87.4 fL (ref 78.0–100.0)
MONO ABS: 0.4 10*3/uL (ref 0.1–1.0)
MONOS PCT: 6 %
Neutro Abs: 5.1 10*3/uL (ref 1.7–7.7)
Neutrophils Relative %: 76 %
PLATELETS: 193 10*3/uL (ref 150–400)
RBC: 3.57 MIL/uL — ABNORMAL LOW (ref 4.22–5.81)
RDW: 17.7 % — AB (ref 11.5–15.5)
WBC: 6.8 10*3/uL (ref 4.0–10.5)

## 2016-07-04 LAB — GLUCOSE, CAPILLARY
GLUCOSE-CAPILLARY: 150 mg/dL — AB (ref 65–99)
Glucose-Capillary: 133 mg/dL — ABNORMAL HIGH (ref 65–99)

## 2016-07-04 LAB — BASIC METABOLIC PANEL
ANION GAP: 8 (ref 5–15)
BUN: 30 mg/dL — ABNORMAL HIGH (ref 6–20)
CHLORIDE: 119 mmol/L — AB (ref 101–111)
CO2: 20 mmol/L — ABNORMAL LOW (ref 22–32)
Calcium: 8.2 mg/dL — ABNORMAL LOW (ref 8.9–10.3)
Creatinine, Ser: 1.47 mg/dL — ABNORMAL HIGH (ref 0.61–1.24)
GFR calc non Af Amer: 40 mL/min — ABNORMAL LOW (ref >60)
GFR, EST AFRICAN AMERICAN: 46 mL/min — AB (ref >60)
Glucose, Bld: 149 mg/dL — ABNORMAL HIGH (ref 65–99)
POTASSIUM: 3.9 mmol/L (ref 3.5–5.1)
SODIUM: 147 mmol/L — AB (ref 135–145)

## 2016-07-04 LAB — PHOSPHORUS: Phosphorus: 3.3 mg/dL (ref 2.5–4.6)

## 2016-07-04 MED ORDER — PANTOPRAZOLE SODIUM 40 MG PO TBEC: 40.0000 mg | DELAYED_RELEASE_TABLET | Freq: Every day | ORAL | Status: DC

## 2016-07-04 MED ORDER — AMOXICILLIN-POT CLAVULANATE 875-125 MG PO TABS: 1.0000 | ORAL_TABLET | Freq: Two times a day (BID) | ORAL | 0 refills | Status: AC

## 2016-07-04 MED ORDER — FOOD THICKENER (SIMPLYTHICK): 2.0000 | Freq: Three times a day (TID) | ORAL | 0 refills | Status: AC

## 2016-07-04 NOTE — Discharge Summary (Signed)
Name: Francisco Roberts MRN: [id number] DOB: 02/26/1922 82 y.o. Francisco: Valentino Nose, MD  Date of Admission: 06/30/2016  8:44 AM Date of Discharge: 07/06/2016 Attending Physician: No att. providers found  Discharge Diagnosis: Principal Problem:   Acute hypoxemic respiratory failure (HCC) Active Problems:   Diabetes mellitus with chronic kidney disease (HCC)   Urinary tract infection without hematuria   Weakness   Pressure injury of skin   Discharge Medications:   Medication List    TAKE these medications   amoxicillin-clavulanate 875-125 MG tablet Commonly known as:  AUGMENTIN Take 1 tablet by mouth 2 (two) times daily. Stop date 10/25   docusate sodium 100 MG capsule Commonly known as:  COLACE Take 1 capsule (100 mg total) by mouth daily as needed.   food thickener Powd Commonly known as:  SIMPLYTHICK Take 2 packets by mouth 4 (four) times daily -  with meals and at bedtime.   multivitamin with minerals Tabs tablet Take 1 tablet by mouth daily.   pantoprazole 40 MG tablet Commonly known as:  PROTONIX Take 1 tablet (40 mg total) by mouth daily.       Disposition and follow-up:   FranciscoShawnmichael Roberts was discharged from [facility] in Good condition.  At the hospital follow up visit please address:  1.  Aspiration Pneumonia Has he completed the 7 day course of augmentin? Please reinforce dysphagia 2 diet.   2.  Labs / imaging needed at time of follow-up: BMET  3.  Pending labs/ test needing follow-up: none   Follow-up Appointments: Follow-up Information    [facility] Follow up on 07/08/2016.   Why:  2:15 PM Contact information: [address] [place] [address] [phone]       [facility] .   Why:  home health PT/OT/aide Contact information: [address] [place] [address] [phone]           Hospital Course by problem list: Principal Problem:   Acute hypoxemic  respiratory failure (HCC) Active Problems:   Diabetes mellitus with chronic kidney disease (HCC)   Urinary tract infection without hematuria   Weakness   Pressure injury of skin   1. Acute hypoxemic respiratory failure  Francisco Roberts is a 82 y.o. male with PMH GI bleed, diabetes mellitus, chronic kidney disease, urinary tract infection, and aspiration pneumonia. He was brought to the [place] ED by his granddaughter who stated that he had increased weakness and lethargy for the last week at home. In the ED he was tachycardic with gurgling breath sounds, he had a K 5.5, SCrt 1.6 and blood cultures were drawn. He was given 1 L NS bolus and developed increased work of breathing and was intubation and transfer to the ICU. In the ICU, there was concern for aspiration PNA vs pneumonitis causing his acute respiratory distress. He was treated with vanc and zosyn x2 days then transitioned to unasyn x2 days then transitioned to oral  augmentin to complete a 7 day course with stop date 10/25. Blood cultures resulted for no growth for 5 days. On the day of discharge he was feeling much better and his vitals were stable. His granddaughter was provided with instructions for a dysphagia 2 diet and he was discharged with close hospital follow up.   Urinary tract infection  On admission he communicated that he had dysuria. Urinalysis had moderate leukocytes but was negative for nitrites. Urine cultures grew staph aureus sensitive to oxicillin.  He was started on broad spectum antibiotics for aspiration pna as mentioned above. On the day of discharge he denied dysuria.     Diabetes mellitus with chronic kidney disease (HCC) CBGs 120 -150 well controlled with sensitive ISS and CBG for monitoring   Chronic deconditioning  Home health PT and OT were arranged at discharge.   Discharge Vitals:   BP 125/68 (BP Location: Left Wrist)   Pulse 89   Temp 98.1 F (36.7 C) (Oral)   Resp 18   Ht 6\' 2"  (1.88 m)   Wt 200 lb  6.4 oz (90.9 kg)   SpO2 97%   BMI 25.73 kg/m   Procedures Performed:  Ct Head Wo Contrast  Result Date: 06/30/2016 CLINICAL DATA:  Lethargy EXAM: CT HEAD WITHOUT CONTRAST TECHNIQUE: Contiguous axial images were obtained from the base of the skull through the vertex without intravenous contrast. COMPARISON:  01/29/2016 FINDINGS: BRAIN: Sulcal and ventricular prominence consistent with superficial and central atrophy. No intraparenchymal hemorrhage, mass effect nor midline shift. Moderate supratentorial white matter hypodensities consistent with chronic chronic small vessel ischemic disease. No acute large vascular territory infarcts. No abnormal extra-axial fluid collections. Basal cisterns are patent. VASCULAR: Moderate calcific atherosclerosis of the carotid siphons. SKULL: No skull fracture. No significant scalp soft tissue swelling. SINUSES/ORBITS: The mastoid air-cells and included paranasal sinuses are well-aerated.The included ocular globes and orbital contents are non-suspicious. OTHER: None. IMPRESSION: Chronic microvascular ischemic disease of bilateral periventricular and right external capsule white matter superimposed on central and superficial atrophy. No acute large vessel territory infarction, extra-axial fluid collection or hemorrhage. Electronically Signed   By: Tollie Eth M.D.   On: 06/30/2016 21:20   Ct Abdomen Pelvis W Contrast  Result Date: 06/30/2016 CLINICAL DATA:  Diffuse abdominal pain, weakness, history of GI bleed EXAM: CT ABDOMEN AND PELVIS WITH CONTRAST TECHNIQUE: Multidetector CT imaging of the abdomen and pelvis was performed using the standard protocol following bolus administration of intravenous contrast. CONTRAST:  80mL ISOVUE-300 IOPAMIDOL (ISOVUE-300) INJECTION 61% COMPARISON:  CT abdomen pelvis of 01/29/2016 FINDINGS: Lower chest: There still opacity in the left lower lobe which could conceivably represent pleural parenchymal scarring, but pneumonia cannot be  excluded. The right lung base is clear. Hepatobiliary: Several small scattered low-attenuation structures throughout the liver are most consistent with hepatic cysts. No calcified gallstones are seen within the slightly distended gallbladder. Pancreas: The pancreas appears somewhat atrophic. The pancreatic duct is not dilated. Spleen: Spleen is unremarkable. Adrenals/Urinary Tract: The right adrenal gland is stable. A probable small left adrenal adenoma also appears stable. The kidneys enhance with no calculus or mass. There is somewhat diminished profusion noted within the lower poles of both kidneys. Some of this lower attenuation could be artifactual, due to the patient's arms lying beside him, but focal pyelonephritis cannot be excluded and clinical correlation is recommended. The ureters are normal in caliber. The urinary bladder is not well distended and appears therefore somewhat thick walled. Stomach/Bowel: The stomach is fluid distended with no abnormality evident. There is no change in minimally prominent small bowel loops compared to the prior CT. Possibly indicating mild ileus. There is some rectal distention with feces and there are scattered rectosigmoid colon diverticula present. The terminal ileum is unremarkable. The appendix is not definitely visualized. Vascular/Lymphatic: The abdominal aorta is normal in caliber with moderate abdominal aortic atherosclerosis present. Reproductive: The prostate is normal in size. Other: Fat does enter the right inguinal canal as noted previously. Musculoskeletal: There is degenerative change throughout the  lumbar spine. No compression deformity is seen. Degenerative change also is noted in the hips. IMPRESSION: 1. Persistent opacity in the left lower lobe could be due to scarring, but pneumonia cannot be excluded. Correlate clinically. 2. Somewhat diminished perfusion of the lower poles of both kidneys may be artifactual as noted above, but focal pyelonephritis  cannot be excluded. 3. Somewhat thick-walled urinary bladder. Cannot exclude inflammation as with cystitis. 4. Moderate abdominal aortic atherosclerosis. 5. Fat does enter the right inguinal canal. Electronically Signed   By: Dwyane DeePaul  Barry M.D.   On: 06/30/2016 11:03   Dg Chest Port 1 View  Result Date: 07/01/2016 CLINICAL DATA:  Respiratory failure. EXAM: PORTABLE CHEST 1 VIEW COMPARISON:  06/30/2016. FINDINGS: Endotracheal tube and NG tube in stable position. Heart size stable. Low lung volumes. Progressive left lower lobe infiltrate. Small left pleural effusion. No pneumothorax. IMPRESSION: 1. Lines and tubes in stable position. 2. Low lung volumes. Progressive left lower lobe infiltrate. Small left pleural effusion. Electronically Signed   By: Maisie Fushomas  Register   On: 07/01/2016 07:51   Dg Chest Portable 1 View  Result Date: 06/30/2016 CLINICAL DATA:  Acute hypoxic respiratory failure. Chronic kidney disease. Endotracheal tube placement. EXAM: PORTABLE CHEST 1 VIEW COMPARISON:  Prior today FINDINGS: There has been interval placement of endotracheal tube and nasogastric tube in appropriate position. No evidence of pneumothorax. Opacity is again seen in the left retrocardiac lung base which may be due to atelectasis or infiltrate. Right lung is clear. Heart size is within normal limits. Aortic atherosclerosis. IMPRESSION: Endotracheal tube and nasogastric tube in appropriate position. Left lower lobe atelectasis versus infiltrate. Electronically Signed   By: Myles RosenthalJohn  Stahl M.D.   On: 06/30/2016 19:37   Dg Chest Portable 1 View  Result Date: 06/30/2016 CLINICAL DATA:  patient is more lethargic and weak today, unable to stand. Patient currently opens eyes spontaneously but is weak. Patient denies any complaints of pain. He is alert and oriented to self and place only. Recently in the hospital for pneumonia and GI bleed EXAM: PORTABLE CHEST - 1 VIEW COMPARISON:  01/29/2016 FINDINGS: Prominent perihilar  interstitial markings left greater than right as before. No confluent airspace disease or overt edema. Heart size upper limits normal for technique.  Atheromatous aorta. No pneumothorax.  No effusion. Visualized bones unremarkable. IMPRESSION: 1. No acute disease. 2.  Aortic Atherosclerosis (ICD10-170.0) Electronically Signed   By: Corlis Leak  Hassell M.D.   On: 06/30/2016 10:09     Discharge Instructions: Discharge Instructions    Call MD for:  difficulty breathing, headache or visual disturbances    Complete by:  As directed    Call MD for:  difficulty breathing, headache or visual disturbances    Complete by:  As directed    Call MD for:  persistant nausea and vomiting    Complete by:  As directed    Call MD for:  severe uncontrolled pain    Complete by:  As directed    Call MD for:  temperature >100.4    Complete by:  As directed    Call MD for:  temperature >100.4    Complete by:  As directed    Diet - low sodium heart healthy    Complete by:  As directed    Diet - low sodium heart healthy    Complete by:  As directed    Increase activity slowly    Complete by:  As directed    Increase activity slowly    Complete by:  As directed       Signed: Eulah Pont, MD 07/06/2016, 10:18 PM   Pager: [phone]

## 2016-07-04 NOTE — Care Management Important Message (Signed)
Important Message  Patient Details  Name: Francisco Roberts MRN: [id number] Date of Birth: 1922/04/26   Medicare Important Message Given:  Yes    [name] Stefan ChurchBratton 07/04/2016, 4:15 PM

## 2016-07-04 NOTE — Progress Notes (Signed)
  Date: 07/04/2016  Patient name: Francisco Roberts  Medical record number: [id number]  Date of birth: Jul 10, 1922   I have seen and evaluated the patient. Case d/w residents in detail. Patient is much improved today. Almost at baseline per grandaughter.   Exam: Gen: awake, alert Lungs: clear to auscultation CVS: RRR with normal heart sounds Abd: soft, non tender, BS + Ext: no edema  Assessment and Plan:  Acute hypoxic Resp Failure: - Likely secondary to aspiration resulting in pneumonitis v/s PNA, CXR showed L LL infiltrate but no fevers or leukocytosis. Will c/w abx for now  - He is now s/p extubation and has improved rapidly - Was transitioned from Unasyn to augmentin to complete 7 day course - No further w/u for now - Stable for d/c home today  - Case d/w granddaughter at bedside who is in agreement with plan    Earl LagosNischal Analleli Gierke, MD 07/04/2016, 8:09 PM

## 2016-07-04 NOTE — Progress Notes (Signed)
   Subjective: Mr. [name] Honda is feeling much better today. He feels that his cough has improved significantly and his abdominal pain has subsided.   Objective:  Vital signs in last 24 hours: Vitals:   07/03/16 2011 07/03/16 2235 07/04/16 0442 07/04/16 0746  BP:  (!) 166/85 (!) 125/96   Pulse:  94 (!) 101 93  Resp:  17 18 18   Temp:  98.4 F (36.9 C) 98.4 F (36.9 C)   TempSrc:  Oral Oral   SpO2: 98% 96% 97% 97%  Weight:   200 lb 6.4 oz (90.9 kg)   Height:       Physical Exam  Constitutional: He appears well-developed and well-nourished. No distress.  Cardiovascular: Normal rate and regular rhythm.   No murmur heard. Pulmonary/Chest: Effort normal. He has no wheezes. He has no rales.  Some gargling breath sounds   Abdominal: Soft. Bowel sounds are normal. There is no tenderness. There is no guarding.   Extremities: no calf tenderness, no peripheral edema   Medications: Infusions:   Scheduled Medications: . ampicillin-sulbactam (UNASYN) IV  3 g Intravenous Q8H  . budesonide (PULMICORT) nebulizer solution  0.25 mg Nebulization BID  . chlorhexidine  15 mL Mouth Rinse BID  . food thickener  2 packet Oral TID WC & HS  . insulin aspart  0-9 Units Subcutaneous TID WC  . mouth rinse  15 mL Mouth Rinse q12n4p  . pantoprazole  40 mg Oral QHS   PRN Medications: sodium chloride  Assessment/Plan:   Acute hypoxemic respiratory failure (HCC) His breathing has improved significantly since the day of admission. Respiratory failure was likely secondary to aspiration pneumonia.  Blood cultures show no growth to date Chest xray showed left lower lobe infiltrate. He has remained afebrile and without leukocytosis and was extubated 10/20 without complication.  Will transition Unasyn to augmentin for a 7 day total course (5 days completed)  Counseled on dysphagia III diet and will provide information on discharge today    Urinary tract infection without hematuria Urine cultures grew  coagulase negative staph. This will be covered by the augmentin we have used to treat his aspiration PNA     Weakness He is bedridden but can perform transfer from bed to chair.  Home health PT and OT at discharge     Diabetes mellitus with chronic kidney disease (HCC) CBGs 120 -150 well controlled with sensitive ISS and CBG for monitoring   Chronic Kidney disease Stage III Baseline Cr 1.8.  Cr has inproved to 1.47 today   Anemia Hgb stable at 10.1 today. Baseline appears to be 11-12. No acute blood loss. Suspect anemia of chronic disease secondary to CKD.  Hypophosphatemia Phos 3.3 today after repletion yesterday   Dispo: Anticipated discharge today    LOS: 4 days   Eulah PontNina Romy Ipock, MD 07/04/2016, 11:05 AM Pager: [phone]

## 2016-07-04 NOTE — Progress Notes (Signed)
Physical Therapy Treatment Patient Details Name: Francisco Roberts MRN: [id number] DOB: Jan 23, 1922 Today's Date: 07/04/2016    History of Present Illness 82 year old male admitted 06/30/16 due to lethargy. PMH significant for DM, CKD, glaucoma, GI bleed, AFib, HOH. CXR indicates LLL infiltrate. Pt intubated 10/19-20-17.     PT Comments    Pt with improved supine > sit and sitting balance today.  [facility] when [facility].  No family present today.  Follow Up Recommendations  Home health PT;Supervision/Assistance - 24 hour (HH aide, HHOT)     Equipment Recommendations  None recommended by PT    Recommendations for Other Services       Precautions / Restrictions Precautions Precautions: Fall Restrictions Weight Bearing Restrictions: No    Mobility  Bed Mobility Overal bed mobility: Needs Assistance Bed Mobility: Supine to Sit     Supine to sit: Mod assist     General bed mobility comments: Pt assisting with LE and needed A to put R hand on rail to A  Transfers Overall transfer level: Needs assistance   Transfers: Sit to/from Stand Sit to Stand: Max assist         General transfer comment: Pt pushing up and giving forth good effort, but not safe and then trying to reach for recliner.  Pt with decreased safety.    Ambulation/Gait                 Stairs            Wheelchair Mobility    Modified Rankin (Stroke Patients Only)       Balance   Sitting-balance support: Feet supported Sitting balance-Leahy Scale: Fair Sitting balance - Comments: Pt able to sit with min/guard     Standing balance-Leahy Scale: Poor                      Cognition Arousal/Alertness: Awake/alert Behavior During Therapy: Flat affect Overall Cognitive Status: No family/caregiver present to determine baseline cognitive functioning                      Exercises      General Comments        Pertinent  Vitals/Pain Pain Assessment: Faces Faces Pain Scale: No hurt    Home Living                      Prior Function            PT Goals (current goals can now be found in the care plan section) Acute Rehab PT Goals PT Goal Formulation: With patient Time For Goal Achievement: 07/16/16 Potential to Achieve Goals: Good Progress towards PT goals: Progressing toward goals    Frequency    Min 3X/week      PT Plan Current plan remains appropriate    Co-evaluation             End of Session Equipment Utilized During Treatment: Gait belt Activity Tolerance: Patient limited by fatigue Patient left: in bed;with call bell/phone within reach;with chair alarm set     Time: [id number] PT Time Calculation (min) (ACUTE ONLY): 16 min  Charges:  $Therapeutic Activity: 8-22 mins                    G Codes:      Francisco Roberts 07/04/2016, 1:16 PM

## 2016-07-04 NOTE — Progress Notes (Signed)
Patient discharged to home via [facility], family is waiting at home for him as per case manager. All belongings sent with patient.

## 2016-07-04 NOTE — Discharge Instructions (Signed)
Thank you for trusting [place] with your medical care!  You were hospitalized for aspiration pneumonia and treated with antibiotics and thickened food   Please take note of the following changes to your medications: Please take augmentin twice daily until 10/25  Please follow a thickened food diet   To make sure you are getting better, please make it to the follow-up appointments listed on the first page.  If you have any questions, please call [phone].  Thickening Liquids for Dysphagia Diet If you are on the dysphagia diet, you may need to thicken drinks, soups, foods that melt at room temperature, and other liquids before you drink or eat them. Thickening liquids makes them easier to swallow. It also reduces the risk of liquid traveling to your lungs. To make a thickened liquid you will need to add a commercial thickening product or a soft food to the liquid until it reaches the consistency it needs to be. Your health care provider or dietitian will explain to you the consistency you need to aim for. Liquid consistencies include:  Thin. Thin liquids include most drinks (such as water, milk, tea, soda, juice, carbonated drinks), as well as ice cream, sherbet, sorbet, ice pops, and broth-based soups.  Nectar-like. Nectar-like liquids include maple syrup and creamy soup.  Honey-like. Honey-like liquids are made to be runny but are thick like honey. They cannot be sipped through a straw.  Spoon-thick. Spoon-thick liquids are thick, like pudding. MY PLAN I should thicken my liquids to a _______________ consistency. DIET GUIDELINES  Thicken liquids to the consistency your health care provider recommends.  Follow your dietitian's or health care provider's recommendation on how to thicken your liquids.  See your dietitian or health care provider regularly for help with your dietary changes. HOW CAN I THICKEN MY LIQUIDS? Liquids can be thickened with a commercial food and beverage thickener  or with a soft food. Engineer, drilling Thickeners A food and beverage thickener is a powder or gel that makes a food or beverage thicker. Thickeners are sold at pharmacies, medical supply stores, some grocery stores, and online. They can be added to both hot and cold liquids and do not change the taste of the liquid. Ask your health care provider or dietitian for a complete list of commercial thickeners. Each thickening product is different. Some need to be blended into a liquid with a blender while others can be stirred into a liquid with a fork or spoon. Follow the instructions on the product label. Soft Foods Some foods such as soups, casseroles, and gravies can be thickened with soft foods. Soft foods include:  Baby cereal.  Gravy powder.  Mashed potato.  Pureed baby food.  Instant potato flakes.  Powdered sauce mixes (such as cheese mixes).  Flour. To use one of these soft food items, stir or mix them into the thin liquid until it reaches the desired thickness. Start with a small amount and adjust soft food and liquid as necessary. Note: Flour works best with warm liquids, such as broth. To thicken a liquid with flour, make a paste out of flour and water. Cook or warm your liquid and add the paste to it. Stir until the mixture thickens. WHAT ARE SOME TIPS TO MAKE THICKENING LIQUIDS EASIER?  Take thickeners with you when eating out or traveling.  If a liquid gets too thick, add more of the thinner liquid until the desired consistency is reached.  Consider purchasing pre-made thickened drinks.  Consider using a thickening  product to make your own frozen desserts.   This information is not intended to replace advice given to you by your health care provider. Make sure you discuss any questions you have with your health care provider. Dysphagia Diet Level 2, Mechanically Altered The dysphagia level 2 diet includes foods that are blended, chopped, ground, or mashed so  they are easier to chew and swallow. The foods are soft, moist, and can be chopped into -inch chunks (such as pancakes, pasta, and bananas). In order to be on this diet, you must be able to chew. This diet helps you transition between the pureed textures of the dysphagia level 1 diet to more solid textures. This diet is helpful for people with mild to moderate swallowing difficulties. It reduces the risk of food getting caught in the windpipe, trachea, or lungs.  You may need help or supervision during meals while following this diet so that you eat safely. You will be on this diet until your health care provider advances the texture of your diet.  WHAT DO I NEED TO KNOW ABOUT THIS DIET? Foods  You may eat foods that are soft and moist.  You may need to use a blender, whisk, or masher to soften some of your foods.  You can moisten foods with gravies, sauces, vegetable or fruit juice, milk, half and half, or water when blending, mashing, or grinding your foods to the right consistency.  If you were on the dysphagia level 1 diet, you may still eat any of the foods included in that diet.  Avoid foods that are dry, hard, sticky, chewy, coarse, and crunchy. Also avoid large cuts of food.  Take small bites. Each bite should contain  inch or less of food. Liquids  Avoid liquids with seeds and chunks.  Thicken liquids, if instructed by your health care provider. Your health care provider will tell you the consistency to which you should thicken your liquids for safe swallowing. To thicken a liquid, use a commercial thickener or a thickening food (such as rice cereal or potato flakes). Ask your health care provider for specific recommendations on thickeners. See your dietitian or health care provider regularly for help with your dietary changes. WHAT FOODS CAN I EAT? Grains Store-bought soft breads that do not have nuts or seeds. Pancakes, sweet rolls, [place] pastries, and [place] toast that have  been moistened with syrup or sauce to form a slurry when blended. Well-cooked pasta, noodles, and bread dressing. Well-cooked noodles and pasta in sauce. Moist macaroni and cheese. Soft dumplings or spaetzle with gravy or butter. Cooked cereals (including oatmeal). Low-texture dry cereals, such as rice puff, corn, or wheat-flake cereals, with milk (if thin liquids are not allowed, make sure all of the milk is absorbed by the cereal before eating it). Vegetables Very soft, well-cooked vegetables in pieces less than  inch in size. Cooked potatoes that are moist, not crispy, and with sauce. Fruits Canned or cooked fruits that are soft or moist and do not have skin or seeds. Fresh, soft bananas. Fruit juices with a small amount of pulp (if thin liquids are allowed). Gelatin or plain gelatin with canned fruit, except pineapple. Meat and Other Protein Sources Tender, moist meats, poultry, or fish cooked with gravy or sauce and cubed to -inch bites or smaller. Ground meat. Moist meatball or meatloaf. Fish without bones. Moist casseroles without rice. Tuna, egg, or meat salad without chunks or hard-to-chew vegetables, such as celery and onions. Smooth quiche without large chunks.  Scrambled, poached, or soft-cooked eggs with butter, margarine, sauce, or gravy. Tofu. Well-cooked, moistened and mashed beans, peas, baked beans, and other legumes. Casseroles without rice (such as tuna noodle casserole or soft moist meat lasagna). Dairy Cream cheese. Yogurt. Cottage cheese. Ask your health care provider if milk is allowed. Sweets/Desserts Pudding. Custard. Soft fruit pies with crust on the bottom only. Crisps and cobblers without seeds or nuts and with soft crusts. Soft, moist cakes. Icing. Pre-gelled cookies. Soft, moist cookies dunked in milk, coffee, or another liquid. Jelly. Soft, smooth chocolate bars that are easily chewed. Jams and preserves without seeds. Ask your health care provider whether you can have  frozen desserts. Fats and Oils Butter. Margarine. Cream for cereal, depending on liquid consistency allowed. Gravy. Cream sauces. Mayonnaise. Salad dressings. Cream cheese. Cheese spreads, plain or with soft fruits or vegetables added. Sour cream. Sour cream dips with soft fruits or vegetables added. Whipped toppings. Other Sauces and salsas that have soft chunks that are about  inch or smaller. The items listed above may not be a complete list of recommended foods or beverages. Contact your dietitian for more options. WHAT FOODS ARE NOT RECOMMENDED? Grains All breads not listed in the recommended list. Breads that are hard or have nuts or seeds. Coarse cereals. Cereals that have nuts, seeds, dried fruits, or coconut. Rice. Corn. Vegetables Whole, raw, frozen, or dried vegetables. Tough, fibrous, chewy, or stringy cooked vegetables, such as celery, peas, broccoli, cabbage, Brussels sprouts, and asparagus. Potato skins. Potato and other vegetable chips. Fried or French-fried potatoes. Cooked corn and peas. Fruits Whole raw, frozen, or dried fruits, including coconut. Pineapple. Fruits with seeds. Meat and Other Protein Sources Dry, tough meats, such as bacon, sausage, and hot dogs. Cheese slices and cubes. Peanut butter. Hard boiled or fried eggs. Nuts. Seeds. Pizza. Sandwiches. Dry casseroles or casseroles with rice or large chunks. Dairy Yogurt with nuts, seeds, or large chunks. Sweets/Desserts Coarse, hard, chewy, or sticky desserts. Any dessert with nuts, seeds, coconut, pineapple, or dried fruit. Ask your health care provider whether you can have frozen desserts. Fats and Oils Avoid fats with chunky, large textures, such as those with nuts or fruits. Other Soups and casseroles with large chunks. The items listed above may not be a complete list of foods and beverages to avoid. Contact your dietitian for more information.   This information is not intended to replace advice given to you  by your health care provider. Make sure you discuss any questions you have with your health care provider.   Document Released: 08/29/2005 Document Revised: 09/19/2014 Document Reviewed: 08/12/2013 Elsevier Interactive Patient Education 2016 Elsevier Inc.    Document Released: 02/28/2012 Document Revised: 09/03/2013 Document Reviewed: 08/12/2013 Elsevier Interactive Patient Education [employer]

## 2016-07-04 NOTE — Progress Notes (Signed)
PT Cancellation Note  Patient Details Name: [name] Eckstrom MRN: [id number] DOB: [birth year]   Cancelled Treatment:    Reason Eval/Treat Not Completed: Patient at procedure or test/unavailable. Pt with large BM and needing to be cleaned up. Nurse tech informed.  Will check back as schedule permits.   Lavoris Canizales LUBECK 07/04/2016, 12:44 PM

## 2016-07-04 NOTE — Care Management Note (Addendum)
Case Management Note  Patient Details  Name: Francisco Roberts MRN: [id number] Date of Birth: 10-Mar-1922  Subjective/Objective:                    Action/Plan:  Ms [name] returned call, she would like [facility] ( has had in past ) . Paged Dr Obie DredgeBlum for orders and face to face .   Ms [name] would also like ambulance transportation home for her grandfather , will prepare paperwork .   PT recommending HHPT /OT and aide. Previous note patient is from home with granddaughter [name], called [phone] no answer and  received message voicemail has not been set up , also called number listed as home phone [phone] left voice mail awaiting call back.  Will need MD orders and face to face also.  Expected Discharge Date:                  Expected Discharge Plan:     In-House Referral:     Discharge planning Services  CM Consult  Post Acute Care Choice:    Choice offered to:     DME Arranged:    DME Agency:     HH Arranged:    HH Agency:     Status of Service:  In process, will continue to follow  If discussed at Long Length of Stay Meetings, dates discussed:    Additional Comments:  [name], [name], RN 07/04/2016, 11:28 AM

## 2016-07-04 NOTE — Care Management (Addendum)
1408 Spoke with [name] husband , he is at the home address and waiting for Mr Kloosterman's arrival. Ambulance called. Ronny FlurryHeather Thera Basden RN BSN    Bedside nurse Aurea GraffJoan ready for NCM to arrange ambulance transport home. Called Ms [name] , awaiting call back to make sure she is at home. She was aware this morning her grandfather would be discharging this afternoon. Ronny FlurryHeather Bartosz Luginbill RN BSN [phone]

## 2016-07-05 ENCOUNTER — Telehealth: Payer: Self-pay

## 2016-07-05 ENCOUNTER — Telehealth: Payer: Self-pay | Admitting: *Deleted

## 2016-07-05 LAB — CULTURE, BLOOD (ROUTINE X 2)
CULTURE: NO GROWTH
Culture: NO GROWTH
Culture: NO GROWTH

## 2016-07-05 NOTE — Telephone Encounter (Signed)
Called lm for rtc 

## 2016-07-05 NOTE — Telephone Encounter (Signed)
grdaughter calls and states that ever since pt came home when he stands up his nose bleeds, also pt states he is dizzy, after questioning more it is found that he has had 2 incidents of nosebleeds appr 1 teaspoon each time also has dizziness every time.  Pt was disch yesterday Please advise

## 2016-07-05 NOTE — Telephone Encounter (Signed)
[facility] nurse regarding pt.

## 2016-07-05 NOTE — Telephone Encounter (Signed)
Please have him evaluated in the clinic as soon as possible. Thank you

## 2016-07-06 ENCOUNTER — Telehealth: Payer: Self-pay

## 2016-07-06 NOTE — Telephone Encounter (Signed)
[facility]. Please call back.

## 2016-07-06 NOTE — Telephone Encounter (Signed)
After several calls I was able to speak w/ pt's grdaugh, ask her to bring pt in thurs instead of fri and she states she cannot do so and pt has not had anymore bleeding episodes, she states she cannot change arrangements for travel and assistance from Friday but that if anything changes she will call 911

## 2016-07-07 ENCOUNTER — Telehealth: Payer: Self-pay | Admitting: Internal Medicine

## 2016-07-07 NOTE — Telephone Encounter (Signed)
Called Amber,PT from [facility] - requesting verbal order for "PT twice a week x 2 weeks; then once a week x 3 weeks".  Verbal Order given - if not ok, let me know. Thanks

## 2016-07-08 ENCOUNTER — Ambulatory Visit: Payer: Medicare HMO

## 2016-07-08 NOTE — Telephone Encounter (Signed)
Sounds good. Thanks 

## 2016-07-11 ENCOUNTER — Ambulatory Visit (INDEPENDENT_AMBULATORY_CARE_PROVIDER_SITE_OTHER): Payer: Medicare HMO | Admitting: Internal Medicine

## 2016-07-11 VITALS — BP 134/65 | HR 73 | Ht 73.0 in | Wt 198.5 lb

## 2016-07-11 DIAGNOSIS — E1122 Type 2 diabetes mellitus with diabetic chronic kidney disease: Secondary | ICD-10-CM

## 2016-07-11 DIAGNOSIS — N183 Chronic kidney disease, stage 3 (moderate): Secondary | ICD-10-CM

## 2016-07-11 DIAGNOSIS — Z09 Encounter for follow-up examination after completed treatment for conditions other than malignant neoplasm: Secondary | ICD-10-CM | POA: Diagnosis not present

## 2016-07-11 DIAGNOSIS — J690 Pneumonitis due to inhalation of food and vomit: Secondary | ICD-10-CM

## 2016-07-11 DIAGNOSIS — Z7189 Other specified counseling: Secondary | ICD-10-CM

## 2016-07-11 DIAGNOSIS — Z8701 Personal history of pneumonia (recurrent): Secondary | ICD-10-CM | POA: Diagnosis not present

## 2016-07-11 DIAGNOSIS — Z87891 Personal history of nicotine dependence: Secondary | ICD-10-CM

## 2016-07-11 NOTE — Progress Notes (Signed)
   CC: hospital follow up for aspiration pneumonia  HPI:  [name] is a 82 y.o. with a PMH of DM, upper GI bleed, CKD, and glaucoma presenting to the clinic for hospital follow up for aspiration pneumonia. Patient required intubation during hospitalization but was successfully extubated. He was treated with IV antibiotics and switched to PO augmentin which he had 2 more days of treatment after discharge. Patient and family were advised on dysphagia 3 diet.   Patient presents with granddaughter Francisco Roberts who is primary caretaker. Patient denies chest pain, shortness of breath, fever, chills, poor appetite, abd pain, N/V/D, dark stools or hematochezia. Ms. Francisco Roberts confirms information. He had completed the antibiotic course without problems; he is eating well and has been doing well with the dysphagia diet without choking or regurgitation episodes.   [facility]. PT/OT have been started but Ms. Roberts has not been contacted about nurse yet. She states that palliative care will re-evaluate him, and is hopeful they will be another helpful resource for symptom management and decision making.   Please see problem based Assessment and Plan for status of patients chronic conditions.  Past Medical History:  Diagnosis Date  . Diabetes mellitus without complication (HCC)   . Glaucoma     Review of Systems:   Review of Systems  Constitutional: Negative for chills and fever.  HENT: Positive for hearing loss (chronic).   Eyes: Negative for double vision and pain.  Respiratory: Negative for cough, hemoptysis, sputum production, shortness of breath and wheezing.   Cardiovascular: Negative for chest pain and leg swelling.  Gastrointestinal: Negative for abdominal pain, blood in stool, constipation, diarrhea, heartburn, melena, nausea and vomiting.  Genitourinary: Negative for dysuria and hematuria.  Musculoskeletal: Negative for falls.  Neurological: Positive for  weakness. Negative for speech change, focal weakness and headaches.    Physical Exam:  Vitals:   07/11/16 1503  BP: 134/65  Pulse: 73  SpO2: 100%  Weight: 198 lb 8 oz (90 kg)  Height: 6\' 1"  (1.854 m)   Physical Exam Constitutional: NAD. VS reviewed CV: RRR, no murmurs, rubs or gallops appreciated, minimal LE edema, pulses intact Resp: CTAB, no increased work of breathing, no wheezing or crackles appreciated Abd: soft, +BS, NDNT Neuro: CN 2-12 intact, Left pupil chronically dilated, R pupil constricted MSK: strength and sensation intact, moves all 4 extremities freely  Assessment & Plan:   See Encounters Tab for problem based charting.   Patient seen with Dr. Lorin Roberts   Kynnedy Carreno, MD Internal Medicine PGY1

## 2016-07-11 NOTE — Patient Instructions (Addendum)
It was a pleasure meeting you all today!  We checked his kidney function today and will call you with the results.  Please call the home health agency to check on getting a nurse out to the home and let us know if we need to do anything on our end to help this out.   Keep doing a great job, and if we can help in any way, please let us know.

## 2016-07-12 ENCOUNTER — Telehealth: Payer: Self-pay | Admitting: Internal Medicine

## 2016-07-12 LAB — BMP8+ANION GAP
Anion Gap: 15 mmol/L (ref 10.0–18.0)
BUN/Creatinine Ratio: 18 (ref 10–24)
BUN: 23 mg/dL (ref 10–36)
CALCIUM: 8.6 mg/dL (ref 8.6–10.2)
CHLORIDE: 109 mmol/L — AB (ref 96–106)
CO2: 20 mmol/L (ref 18–29)
CREATININE: 1.26 mg/dL (ref 0.76–1.27)
GFR calc non Af Amer: 50 mL/min/{1.73_m2} — ABNORMAL LOW (ref >59)
GFR, EST AFRICAN AMERICAN: 57 mL/min/{1.73_m2} — AB (ref >59)
GLUCOSE: 139 mg/dL — AB (ref 65–99)
Potassium: 4.4 mmol/L (ref 3.5–5.2)
Sodium: 144 mmol/L (ref 134–144)

## 2016-07-12 NOTE — Assessment & Plan Note (Signed)
Patient previously evaluated to palliative care in 2016 admission and was made DNR on discussion with granddaughter; he was sent home with hospice at that time. Granddaughter states that home hospice was discontinued after some time due to pt doing well. During most recent admission for aspiration pneumonia, patient was intubated and successfully extubated. Discussion was had after extubation with admitting team and granddaughter and at the time, granddaughter was agreeable to intubation but not chest compressions or shock.   Granddaughter states palliative care is coming to re-evaluate patient and she is hopefull this can be an extra resource. During clinic visit, code status was briefly discussed without concrete decision by granddaughter.   Plan: --I agree with palliative care involvement with this patient who now has had two episodes of aspiration pneumonia, one requiring intubation  --Granddaughter made aware we are available as a resource for goals of care discussion and facilitation of palliative care consult

## 2016-07-12 NOTE — Telephone Encounter (Signed)
Attempted calling pt's caretaker/granddaughter [name] twice about results of BMP per her request. Will attempt tomorrow.  Nyra MarketGorica Keria Widrig, MD IMTS - PGY1 Pager [phone]

## 2016-07-12 NOTE — Assessment & Plan Note (Signed)
Patient with CKD stage 3, which remained stable during hospitalization.  Plan: -repeat BMET - Cr 1.2, GFR  57 stable

## 2016-07-12 NOTE — Assessment & Plan Note (Addendum)
Patient  with completed course of antibiotics post discharge; respiratory exam shows clear lungs. Granddaughter as caretaker aware of aspiration precautions and appropriate food choices. This is second episode of aspiration pneumonia in 6 months with most recent episode requiring intubation; per granddaughter patient was evaluated in past by palliative care but not found to be candidate but he is going to be re-evaluated. Introduction to goals of care discussion was started with family about whether they and the patient would wish to be intubated again if required. I think family would benefit from further detailed discussion on goals of care.

## 2016-07-13 NOTE — Progress Notes (Signed)
Internal Medicine Clinic Attending  I saw and evaluated the patient.  I personally confirmed the key portions of the history and exam documented by Dr. Svalina and I reviewed pertinent patient test results.  The assessment, diagnosis, and plan were formulated together and I agree with the documentation in the resident's note.  

## 2016-07-14 NOTE — Telephone Encounter (Signed)
Spoke with Ms. Francisco Roberts about BMP result which showed stable kidney function. Ms. Francisco Roberts was appreciative of call and had no further questions.  Nyra MarketGorica Kermitt Harjo, MD IMTS - PGY1 Pager [phone]

## 2016-07-24 ENCOUNTER — Inpatient Hospital Stay (HOSPITAL_COMMUNITY)
Admission: EM | Admit: 2016-07-24 | Discharge: 2016-08-12 | DRG: 377 | Disposition: E | Payer: Medicare HMO | Attending: Pulmonary Disease | Admitting: Pulmonary Disease

## 2016-07-24 ENCOUNTER — Encounter (HOSPITAL_COMMUNITY): Payer: Self-pay | Admitting: Emergency Medicine

## 2016-07-24 ENCOUNTER — Emergency Department (HOSPITAL_COMMUNITY): Payer: Medicare HMO

## 2016-07-24 ENCOUNTER — Inpatient Hospital Stay (HOSPITAL_COMMUNITY): Payer: Medicare HMO

## 2016-07-24 DIAGNOSIS — I4510 Unspecified right bundle-branch block: Secondary | ICD-10-CM | POA: Diagnosis present

## 2016-07-24 DIAGNOSIS — I4891 Unspecified atrial fibrillation: Secondary | ICD-10-CM | POA: Diagnosis not present

## 2016-07-24 DIAGNOSIS — Z87891 Personal history of nicotine dependence: Secondary | ICD-10-CM | POA: Diagnosis not present

## 2016-07-24 DIAGNOSIS — I468 Cardiac arrest due to other underlying condition: Secondary | ICD-10-CM | POA: Diagnosis present

## 2016-07-24 DIAGNOSIS — K922 Gastrointestinal hemorrhage, unspecified: Secondary | ICD-10-CM | POA: Diagnosis present

## 2016-07-24 DIAGNOSIS — K254 Chronic or unspecified gastric ulcer with hemorrhage: Secondary | ICD-10-CM | POA: Diagnosis not present

## 2016-07-24 DIAGNOSIS — G253 Myoclonus: Secondary | ICD-10-CM | POA: Diagnosis present

## 2016-07-24 DIAGNOSIS — R578 Other shock: Secondary | ICD-10-CM | POA: Diagnosis present

## 2016-07-24 DIAGNOSIS — F0390 Unspecified dementia without behavioral disturbance: Secondary | ICD-10-CM | POA: Diagnosis present

## 2016-07-24 DIAGNOSIS — R000 Tachycardia, unspecified: Secondary | ICD-10-CM | POA: Diagnosis present

## 2016-07-24 DIAGNOSIS — J9601 Acute respiratory failure with hypoxia: Secondary | ICD-10-CM

## 2016-07-24 DIAGNOSIS — K219 Gastro-esophageal reflux disease without esophagitis: Secondary | ICD-10-CM | POA: Diagnosis present

## 2016-07-24 DIAGNOSIS — I469 Cardiac arrest, cause unspecified: Secondary | ICD-10-CM | POA: Diagnosis present

## 2016-07-24 DIAGNOSIS — T68XXXA Hypothermia, initial encounter: Secondary | ICD-10-CM | POA: Diagnosis present

## 2016-07-24 DIAGNOSIS — D62 Acute posthemorrhagic anemia: Secondary | ICD-10-CM | POA: Diagnosis present

## 2016-07-24 DIAGNOSIS — G931 Anoxic brain damage, not elsewhere classified: Secondary | ICD-10-CM

## 2016-07-24 DIAGNOSIS — E876 Hypokalemia: Secondary | ICD-10-CM | POA: Diagnosis present

## 2016-07-24 DIAGNOSIS — L8990 Pressure ulcer of unspecified site, unspecified stage: Secondary | ICD-10-CM | POA: Insufficient documentation

## 2016-07-24 DIAGNOSIS — E872 Acidosis: Secondary | ICD-10-CM | POA: Diagnosis present

## 2016-07-24 DIAGNOSIS — E1165 Type 2 diabetes mellitus with hyperglycemia: Secondary | ICD-10-CM | POA: Diagnosis present

## 2016-07-24 DIAGNOSIS — N170 Acute kidney failure with tubular necrosis: Secondary | ICD-10-CM | POA: Diagnosis present

## 2016-07-24 DIAGNOSIS — Z66 Do not resuscitate: Secondary | ICD-10-CM | POA: Diagnosis not present

## 2016-07-24 DIAGNOSIS — J690 Pneumonitis due to inhalation of food and vomit: Secondary | ICD-10-CM | POA: Diagnosis present

## 2016-07-24 HISTORY — DX: Gastrointestinal hemorrhage, unspecified: K92.2

## 2016-07-24 HISTORY — DX: Gastro-esophageal reflux disease without esophagitis: K21.9

## 2016-07-24 LAB — CBC
HEMATOCRIT: 34.5 % — AB (ref 39.0–52.0)
HEMATOCRIT: 34.9 % — AB (ref 39.0–52.0)
HEMOGLOBIN: 11 g/dL — AB (ref 13.0–17.0)
HEMOGLOBIN: 11.2 g/dL — AB (ref 13.0–17.0)
MCH: 28.8 pg (ref 26.0–34.0)
MCH: 28.3 pg (ref 26.0–34.0)
MCHC: 31.9 g/dL (ref 30.0–36.0)
MCHC: 32.1 g/dL (ref 30.0–36.0)
MCV: 90.3 fL (ref 78.0–100.0)
MCV: 88.1 fL (ref 78.0–100.0)
Platelets: 196 10*3/uL (ref 150–400)
Platelets: 198 10*3/uL (ref 150–400)
RBC: 3.82 MIL/uL — AB (ref 4.22–5.81)
RBC: 3.96 MIL/uL — AB (ref 4.22–5.81)
RDW: 17.9 % — ABNORMAL HIGH (ref 11.5–15.5)
RDW: 17.6 % — ABNORMAL HIGH (ref 11.5–15.5)
WBC: 7.7 10*3/uL (ref 4.0–10.5)
WBC: 7 10*3/uL (ref 4.0–10.5)

## 2016-07-24 LAB — TROPONIN I
TROPONIN I: 0.21 ng/mL — AB (ref <0.03)
Troponin I: 0.05 ng/mL (ref <0.03)
Troponin I: 0.1 ng/mL (ref <0.03)

## 2016-07-24 LAB — I-STAT CHEM 8, ED
BUN: 37 mg/dL — ABNORMAL HIGH (ref 6–20)
CHLORIDE: 109 mmol/L (ref 101–111)
CREATININE: 1.4 mg/dL — AB (ref 0.61–1.24)
Calcium, Ion: 1.15 mmol/L (ref 1.15–1.40)
GLUCOSE: 366 mg/dL — AB (ref 65–99)
HEMATOCRIT: 33 % — AB (ref 39.0–52.0)
HEMOGLOBIN: 11.2 g/dL — AB (ref 13.0–17.0)
POTASSIUM: 3.4 mmol/L — AB (ref 3.5–5.1)
Sodium: 146 mmol/L — ABNORMAL HIGH (ref 135–145)
TCO2: 19 mmol/L (ref 0–100)

## 2016-07-24 LAB — CBC WITH DIFFERENTIAL/PLATELET
Basophils Absolute: 0 10*3/uL (ref 0.0–0.1)
Basophils Relative: 0 %
EOS PCT: 0 %
Eosinophils Absolute: 0 10*3/uL (ref 0.0–0.7)
HEMATOCRIT: 33.4 % — AB (ref 39.0–52.0)
Hemoglobin: 10.3 g/dL — ABNORMAL LOW (ref 13.0–17.0)
LYMPHS ABS: 2.7 10*3/uL (ref 0.7–4.0)
LYMPHS PCT: 21 %
MCH: 28.5 pg (ref 26.0–34.0)
MCHC: 30.8 g/dL (ref 30.0–36.0)
MCV: 92.3 fL (ref 78.0–100.0)
MONO ABS: 0.4 10*3/uL (ref 0.1–1.0)
MONOS PCT: 3 %
NEUTROS ABS: 9.7 10*3/uL — AB (ref 1.7–7.7)
Neutrophils Relative %: 76 %
PLATELETS: 174 10*3/uL (ref 150–400)
RBC: 3.62 MIL/uL — ABNORMAL LOW (ref 4.22–5.81)
RDW: 17.8 % — AB (ref 11.5–15.5)
WBC: 12.8 10*3/uL — ABNORMAL HIGH (ref 4.0–10.5)

## 2016-07-24 LAB — COMPREHENSIVE METABOLIC PANEL
ALBUMIN: 2.5 g/dL — AB (ref 3.5–5.0)
ALK PHOS: 125 U/L (ref 38–126)
ALT: 124 U/L — AB (ref 17–63)
ANION GAP: 19 — AB (ref 5–15)
AST: 175 U/L — ABNORMAL HIGH (ref 15–41)
BILIRUBIN TOTAL: 0.3 mg/dL (ref 0.3–1.2)
BUN: 31 mg/dL — AB (ref 6–20)
CALCIUM: 8.7 mg/dL — AB (ref 8.9–10.3)
CO2: 15 mmol/L — AB (ref 22–32)
CREATININE: 1.83 mg/dL — AB (ref 0.61–1.24)
Chloride: 110 mmol/L (ref 101–111)
GFR calc Af Amer: 35 mL/min — ABNORMAL LOW (ref >60)
GFR calc non Af Amer: 31 mL/min — ABNORMAL LOW (ref >60)
GLUCOSE: 378 mg/dL — AB (ref 65–99)
Potassium: 3.4 mmol/L — ABNORMAL LOW (ref 3.5–5.1)
Sodium: 144 mmol/L (ref 135–145)
TOTAL PROTEIN: 5.4 g/dL — AB (ref 6.5–8.1)

## 2016-07-24 LAB — I-STAT CG4 LACTIC ACID, ED: Lactic Acid, Venous: 10.45 mmol/L (ref 0.5–1.9)

## 2016-07-24 LAB — TYPE AND SCREEN
ABO/RH(D): O POS
Antibody Screen: NEGATIVE

## 2016-07-24 LAB — ETHANOL: Alcohol, Ethyl (B): <5 mg/dL (ref <5)

## 2016-07-24 LAB — GLUCOSE, CAPILLARY
GLUCOSE-CAPILLARY: 94 mg/dL (ref 65–99)
Glucose-Capillary: 284 mg/dL — ABNORMAL HIGH (ref 65–99)
Glucose-Capillary: 219 mg/dL — ABNORMAL HIGH (ref 65–99)
Glucose-Capillary: 146 mg/dL — ABNORMAL HIGH (ref 65–99)

## 2016-07-24 LAB — PROTIME-INR
INR: 1.16
Prothrombin Time: 14.9 seconds (ref 11.4–15.2)

## 2016-07-24 LAB — LACTIC ACID, PLASMA
LACTIC ACID, VENOUS: 6.6 mmol/L — AB (ref 0.5–1.9)
Lactic Acid, Venous: 7.7 mmol/L (ref 0.5–1.9)

## 2016-07-24 LAB — I-STAT ARTERIAL BLOOD GAS, ED
ACID-BASE DEFICIT: 10 mmol/L — AB (ref 0.0–2.0)
BICARBONATE: 18.3 mmol/L — AB (ref 20.0–28.0)
O2 SAT: 100 %
PO2 ART: 424 mmHg — AB (ref 83.0–108.0)
TCO2: 20 mmol/L (ref 0–100)
pCO2 arterial: 41.9 mmHg (ref 32.0–48.0)
pH, Arterial: 7.216 — ABNORMAL LOW (ref 7.350–7.450)

## 2016-07-24 LAB — I-STAT TROPONIN, ED: TROPONIN I, POC: 0.03 ng/mL (ref 0.00–0.08)

## 2016-07-24 LAB — ABO/RH: ABO/RH(D): O POS

## 2016-07-24 LAB — MRSA PCR SCREENING: MRSA by PCR: NEGATIVE

## 2016-07-24 LAB — AMMONIA: Ammonia: 69 umol/L — ABNORMAL HIGH (ref 9–35)

## 2016-07-24 LAB — TRIGLYCERIDES: TRIGLYCERIDES: 61 mg/dL (ref <150)

## 2016-07-24 MED ORDER — ALBUTEROL SULFATE (2.5 MG/3ML) 0.083% IN NEBU: 2.5000 mg | INHALATION_SOLUTION | RESPIRATORY_TRACT | Status: DC | PRN

## 2016-07-24 MED ORDER — PIPERACILLIN-TAZOBACTAM 3.375 G IVPB 30 MIN
3.3750 g | Freq: Once | INTRAVENOUS | Status: AC
  Administered 2016-07-24: 3.375 g via INTRAVENOUS
  Filled 2016-07-24: qty 50

## 2016-07-24 MED ORDER — INSULIN ASPART 100 UNIT/ML ~~LOC~~ SOLN: 1.0000 [IU] | SUBCUTANEOUS | Status: DC

## 2016-07-24 MED ORDER — PHENYLEPHRINE HCL 10 MG/ML IJ SOLN: 30.0000 ug/min | INTRAVENOUS | Status: DC

## 2016-07-24 MED ORDER — ORAL CARE MOUTH RINSE
15.0000 mL | OROMUCOSAL | Status: DC
  Administered 2016-07-24 – 2016-07-25 (×13): 15 mL via OROMUCOSAL

## 2016-07-24 MED ORDER — ACETAMINOPHEN 325 MG PO TABS: 650.0000 mg | ORAL_TABLET | ORAL | Status: DC | PRN

## 2016-07-24 MED ORDER — FENTANYL CITRATE (PF) 100 MCG/2ML IJ SOLN
50.0000 ug | INTRAMUSCULAR | Status: DC | PRN
  Administered 2016-07-24: 50 ug via INTRAVENOUS
  Filled 2016-07-24: qty 2

## 2016-07-24 MED ORDER — INSULIN ASPART 100 UNIT/ML ~~LOC~~ SOLN
0.0000 [IU] | SUBCUTANEOUS | Status: DC
  Administered 2016-07-24: 11 [IU] via SUBCUTANEOUS
  Administered 2016-07-24: 3 [IU] via SUBCUTANEOUS
  Administered 2016-07-24: 7 [IU] via SUBCUTANEOUS
  Administered 2016-07-25 (×2): 3 [IU] via SUBCUTANEOUS

## 2016-07-24 MED ORDER — SODIUM CHLORIDE 0.9 % IV SOLN
8.0000 mg/h | INTRAVENOUS | Status: DC
  Administered 2016-07-24 – 2016-07-25 (×5): 8 mg/h via INTRAVENOUS
  Filled 2016-07-24 (×8): qty 80

## 2016-07-24 MED ORDER — SODIUM CHLORIDE 0.9 % IV BOLUS (SEPSIS)
1000.0000 mL | Freq: Once | INTRAVENOUS | Status: AC
  Administered 2016-07-24: 1000 mL via INTRAVENOUS

## 2016-07-24 MED ORDER — FENTANYL CITRATE (PF) 100 MCG/2ML IJ SOLN: 50.0000 ug | INTRAMUSCULAR | Status: DC | PRN

## 2016-07-24 MED ORDER — ONDANSETRON HCL 4 MG/2ML IJ SOLN: 4.0000 mg | Freq: Four times a day (QID) | INTRAMUSCULAR | Status: DC | PRN

## 2016-07-24 MED ORDER — CHLORHEXIDINE GLUCONATE 0.12% ORAL RINSE (MEDLINE KIT)
15.0000 mL | Freq: Two times a day (BID) | OROMUCOSAL | Status: DC
  Administered 2016-07-24 – 2016-07-25 (×3): 15 mL via OROMUCOSAL

## 2016-07-24 MED ORDER — PIPERACILLIN-TAZOBACTAM 3.375 G IVPB
3.3750 g | Freq: Three times a day (TID) | INTRAVENOUS | Status: DC
  Administered 2016-07-24: 3.375 g via INTRAVENOUS
  Filled 2016-07-24 (×3): qty 50

## 2016-07-24 MED ORDER — PROPOFOL 1000 MG/100ML IV EMUL: 0.0000 ug/kg/min | INTRAVENOUS | Status: DC

## 2016-07-24 MED ORDER — SODIUM CHLORIDE 0.9 % IV SOLN
INTRAVENOUS | Status: DC
  Administered 2016-07-24: 10:00:00 via INTRAVENOUS

## 2016-07-24 MED ORDER — INSULIN ASPART 100 UNIT/ML ~~LOC~~ SOLN: 2.0000 [IU] | SUBCUTANEOUS | Status: DC

## 2016-07-24 MED ORDER — PIPERACILLIN-TAZOBACTAM 3.375 G IVPB
3.3750 g | Freq: Three times a day (TID) | INTRAVENOUS | Status: DC
  Administered 2016-07-25: 3.375 g via INTRAVENOUS
  Filled 2016-07-24 (×4): qty 50

## 2016-07-24 MED ORDER — SODIUM CHLORIDE 0.9 % IV SOLN: 250.0000 mL | INTRAVENOUS | Status: DC | PRN

## 2016-07-24 MED ORDER — SODIUM CHLORIDE 0.9 % IV SOLN
80.0000 mg | Freq: Once | INTRAVENOUS | Status: AC
  Administered 2016-07-24: 80 mg via INTRAVENOUS
  Filled 2016-07-24: qty 80

## 2016-07-24 NOTE — Plan of Care (Signed)
  Interdisciplinary Goals of Care Family Meeting   Date carried out:: 2015-11-21  Location of the meeting: Conference room  Member's involved: Nurse Practitioner, Chaplain and Family Member or next of kin  Durable Power of Attorney or acting medical decision maker: Granddaughter     Discussion: We discussed goals of care for Francisco Roberts .  Updated family on current state of critical illness. Patient is post PEA arrest the setting of suspected GI bleed. Initial evaluation demonstrates mild anemia, significantly elevated lactic acid, AKI, altered mental status and acute respiratory failure.     Code status: Full DNR.  Continue mechanical vent support for now.  Disposition: Continue current acute care  Time spent for the meeting: 20 minutes.      Canary BrimBrandi Ollis, NP-C [facility] Pgr: [phone] or if no answer [phone] 2015-11-21, 9:43 AM   Reviewed with the granddaughter independently.  Agree with this plan  Heber CarolinaBrent McQuaid, MD [facility] Pager: [phone] Cell: [phone] After 3pm or if no response, call [phone]

## 2016-07-24 NOTE — Procedures (Signed)
History: 82 yo M s/p PEA arrest  Sedation: intermittent fentanyl  Technique: This is a 21 channel routine scalp EEG performed at the bedside with bipolar and monopolar montages arranged in accordance to the international 10/20 system of electrode placement. One channel was dedicated to EKG recording.    Background: The background is severely attenuated with the exception of occasional isolated discharges associated with what appears on video to be generalized myoclonic jerks.   Photic stimulation: Physiologic driving is not performed  EEG Abnormalities: 1) Generalized suppression 2) Isolated bursts associated with generalized myoclonus.   Clinical Interpretation: This EEG is consistent with a profound cerebral dysfunction. In the post-hypoxic setting with the absence of sedating medications, this EEG is highly correlated with dismal prognosis.   Ritta SlotMcNeill Karren Newland, MD [facility] [phone]  If 7pm- 7am, please page neurology on call as listed in AMION.

## 2016-07-24 NOTE — Progress Notes (Signed)
   08/02/2016 0730  Clinical Encounter Type  Visited With Patient and family together  Visit Type ED  Referral From Other (Comment) (Pager)  Spiritual Encounters  Spiritual Needs Emotional  Stress Factors  Patient Stress Factors Not reviewed  Family Stress Factors Family relationships  Pt's family contacted shortly after Pt arrived. Escorted family to consult B. MD explained procedures. Escorted family to Trauma C.

## 2016-07-24 NOTE — ED Provider Notes (Signed)
[facility]-EMERGENCY DEPT Provider Note   CSN: [id number] Arrival date & time: 08/07/2016  0707     History   Chief Complaint Chief Complaint  Patient presents with  . GI Bleeding  . Loss of Consciousness    HPI Francisco Roberts is a 82 y.o. male.  HPI Patient presents post CPR with return of vitals. Last seen by family at 6:30 and at 6:15 was found covered and coffee-ground emesis. Initial agonal rhythm for EMS without pulse. CPR performed for 15 minutes with 3 epinephrine's. I align placed intubated by EMS. Has had vitals since. Mostly unresponsive. Reportedly only on Protonix. Later after discussing the family found to have previous history of GI bleeds and diabetes. Patient reportedly would want intubation. His baseline is conversive but confused. Ambulated substance assistance at baseline.   Past Medical History:  Diagnosis Date  . GERD (gastroesophageal reflux disease)   . GI bleed     There are no active problems to display for this patient.   No past surgical history on file.     Home Medications    Prior to Admission medications   Not on File    Family History No family history on file.  Social History Social History  Substance Use Topics  . Smoking status: Not on file  . Smokeless tobacco: Not on file  . Alcohol use Not on file     Allergies   Patient has no known allergies.   Review of Systems Review of Systems  Unable to perform ROS: Intubated     Physical Exam Updated Vital Signs BP 129/73   Pulse 84   Resp 21   Ht 6' (1.829 m)   Wt 175 lb (79.4 kg)   SpO2 100%   BMI 23.73 kg/m   Physical Exam  Constitutional: He appears well-developed.  Patient is covered with what appears to be coffee-ground emesis.  Eyes:  Left pupil irregular and likely post cataract repair. Right pupil mildly constricted.  Neck: No JVD present.  Cardiovascular: Normal rate.   Pulmonary/Chest:  Intubated with equal breath sounds bilaterally.  Abdominal: Soft.  There is no tenderness.  Musculoskeletal: He exhibits no edema.  Neurological:  Patient is agonal against ET tube. No real response to pain.  Skin: Skin is warm.     ED Treatments / Results  Labs (all labs ordered are listed, but only abnormal results are displayed) Labs Reviewed  CBC WITH DIFFERENTIAL/PLATELET - Abnormal; Notable for the following:       Result Value   WBC 12.8 (*)    RBC 3.62 (*)    Hemoglobin 10.3 (*)    HCT 33.4 (*)    RDW 17.8 (*)    Neutro Abs 9.7 (*)    All other components within normal limits  I-STAT CHEM 8, ED - Abnormal; Notable for the following:    Sodium 146 (*)    Potassium 3.4 (*)    BUN 37 (*)    Creatinine, Ser 1.40 (*)    Glucose, Bld 366 (*)    Hemoglobin 11.2 (*)    HCT 33.0 (*)    All other components within normal limits  I-STAT CG4 LACTIC ACID, ED - Abnormal; Notable for the following:    Lactic Acid, Venous 10.45 (*)    All other components within normal limits  I-STAT ARTERIAL BLOOD GAS, ED - Abnormal; Notable for the following:    pH, Arterial 7.216 (*)    pO2, Arterial 424.0 (*)    Bicarbonate 18.3 (*)  Acid-base deficit 10.0 (*)    All other components within normal limits  PROTIME-INR  COMPREHENSIVE METABOLIC PANEL  ETHANOL  AMMONIA  URINALYSIS, ROUTINE W REFLEX MICROSCOPIC (NOT AT [facility])  RAPID URINE DRUG SCREEN, HOSP PERFORMED  TRIGLYCERIDES  I-STAT TROPOININ, ED  TYPE AND SCREEN    EKG  EKG Interpretation  Date/Time:  Sunday July 24 2016 07:13:20 EST Ventricular Rate:  89 PR Interval:    QRS Duration: 135 QT Interval:  415 QTC Calculation: 505 R Axis:   -14 Text Interpretation:  Sinus or ectopic atrial rhythm Multiple ventricular premature complexes Borderline short PR interval Right bundle branch block Confirmed by Rubin PayorPICKERING  MD, Harrold DonathNATHAN [phone]([id number]) on 12-May-2016 7:19:52 AM Also confirmed by Rubin PayorPICKERING  MD, Dequan Kindred [phone]([id number]), editor [place]  CCT, BEVERLY ([id number])  on 12-May-2016 7:47:26 AM        Radiology Dg Chest Portable 1 View  Result Date: 12-May-2016 CLINICAL DATA:  Cardiopulmonary resuscitation.  Intubation. EXAM: PORTABLE CHEST 1 VIEW COMPARISON:  None. FINDINGS: Heart size is normal. There is aortic atherosclerosis. Endotracheal tube has its tip 3 cm above the carina. Nasogastric tube enters the abdomen. Lungs well aerated. There is mild diffuse interstitial edema. No pneumothorax or rib fracture seen post resuscitation. IMPRESSION: Endotracheal tube and nasogastric tube well positioned. Mild interstitial edema. Aortic atherosclerosis. Electronically Signed   By: Paulina FusiMark  Shogry M.D.   On: 12-May-2016 07:48    Procedures Procedures (including critical care time)  Medications Ordered in ED Medications  pantoprazole (PROTONIX) 80 mg in sodium chloride 0.9 % 100 mL IVPB (80 mg Intravenous New Bag/Given 07-02-2016 0804)  pantoprazole (PROTONIX) 80 mg in sodium chloride 0.9 % 250 mL (0.32 mg/mL) infusion (not administered)  fentaNYL (SUBLIMAZE) injection 50 mcg (50 mcg Intravenous Given 07-02-2016 0737)  fentaNYL (SUBLIMAZE) injection 50 mcg (not administered)  propofol (DIPRIVAN) 1000 MG/100ML infusion (not administered)     Initial Impression / Assessment and Plan / ED Course  I have reviewed the triage vital signs and the nursing notes.  Pertinent labs & imaging results that were available during my care of the patient were reviewed by me and considered in my medical decision making (see chart for details).  Clinical Course     Patient presents post CPR. Return of vitals and vitals appear stable without pressors. Unresponsive. Not a cooling candidate due to the GI bleed. Initial lactate of 10 is a poor prognostic indicator. However his other labs are not as abnormal as expected. Hemoglobin is 10 but may not adjust to the bleed yet. PH of 7.21. Discuss with family and critical care. Admit to ICU.  CRITICAL CARE Performed by: Billee CashingPICKERING,Burdell Peed R. Total critical care time: 30  minutes Critical care time was exclusive of separately billable procedures and treating other patients. Critical care was necessary to treat or prevent imminent or life-threatening deterioration. Critical care was time spent personally by me on the following activities: development of treatment plan with patient and/or surrogate as well as nursing, discussions with consultants, evaluation of patient's response to treatment, examination of patient, obtaining history from patient or surrogate, ordering and performing treatments and interventions, ordering and review of laboratory studies, ordering and review of radiographic studies, pulse oximetry and re-evaluation of patient's condition.   Final Clinical Impressions(s) / ED Diagnoses   Final diagnoses:  Acute GI bleeding  Cardiac arrest [facility](HCC)    New Prescriptions New Prescriptions   No medications on file     Benjiman CoreNathan Erlinda Solinger, MD 07-02-2016 [phone]

## 2016-07-24 NOTE — ED Triage Notes (Signed)
Received pt from home via EMS with c/o found in bed unresponsive and covered in bloody emesis by family at [id number]. Pt was last seen normal by family at [id number]. EMS initial rhythm was agonal. EMS performed 15 mins of CPR giving a total of 3 epi. IO placed in Right LE by EMS, pt intubated with 7.0 ETT tube 25@ the lip by EMS.

## 2016-07-24 NOTE — Progress Notes (Signed)
EEG Completed; Results Pending  

## 2016-07-24 NOTE — Progress Notes (Signed)
Pharmacy Antibiotic Note  [name] Salts is a 82 y.o. male admitted on Oct 17, 2015 with pneumonia.  Pharmacy has been consulted for Zosyn dosing.  Plan: Zosyn 3.375g IV q8h (4 hour infusion).  Height: 6' (182.9 cm) Weight: 190 lb (86.2 kg) IBW/kg (Calculated) : 77.6  No data recorded.   Recent Labs Lab 08-Jun-2016 0717 08-Jun-2016 0731 08-Jun-2016 0732  WBC 12.8*  --   --   CREATININE 1.83* 1.40*  --   LATICACIDVEN  --   --  10.45*    Estimated Creatinine Clearance: 37.7 mL/min (by C-G formula based on SCr of 1.4 mg/dL (H)).    No Known Allergies  Antimicrobials this admission: Zosyn 11/12 >>   Dose adjustments this admission: n/a  Microbiology results:  11/12 Sputum:    Thank you for allowing pharmacy to be a part of this patient's care.  Silvia Hightower D. Jazminn Pomales, PharmD, BCPS Clinical Pharmacist Pager: [phone] Oct 17, 2015 9:39 AM

## 2016-07-24 NOTE — H&P (Signed)
PULMONARY / CRITICAL CARE MEDICINE   Name: Francisco Roberts MRN: [id number] DOB: 1922/03/25    ADMISSION DATE:  08/01/2016 CONSULTATION DATE:  08/01/2016  REFERRING MD:  Dr. Rubin PayorPickering   CHIEF COMPLAINT:  PEA Arrest, GIB   HISTORY OF PRESENT ILLNESS:  82 y/o M, former smoker, with a past medical history of glaucoma s/p surgery on left eye, dementia, GERD, prior GI bleed (01/2016) & recent admission from 10/19-10/25 for UTI with associated AKI & ventilator dependent respiratory failure who [facility] with concerns for GI bleed and PEA arrest.  The granddaughter is the patient's primary caretaker and she provides history. She reports last week he reached over and took her hand and told her that he was "going to die soon".  The day prior to admission he was in his usual state of health -ambulatory at home, able to feed himself, conversive but confused.  She put him to bed around 10 PM. Early a.m. on 11/12 her husband noted the patient had vomited on himself and appeared altered (~6am).  At that time he indicated his stomach hurt. EMS was activated and found him to have coffee-ground emesis as well as stool incontinence. He also was unresponsive with an agonal rhythm (PEA).  CPR was performed for proximally 15 minutes with 3 rounds of epinephrine, the patient was also intubated.  Initial ER evaluation found the patient to be obtunded on ventilator.  ABG 7.216 / 41 / 424 / 18.3.  Initial labs-NA 144, K 3.4, sr cr 1.83, glucose 378, anion gap 19, AST 175, ALT 124, ammonia 69, troponin 0.03, lactic acid 10.45, WBC 12.8, Hgb 10.3 and platelets 174. INR 1.16. Ethyl alcohol less than 5. Chest x-ray demonstrated mild interstitial edema. He was also noted to be hypothermic with core temp of 32.  EKG showed sinus tachycardia with right bundle branch block which was noted to be present on prior EKGs. Initially he was noted to be hemodynamically stable. However, later in his ER course his blood  pressure dropped into the 70s systolic requiring normal saline bolus.  PCCM consulted for ICU admission.  PAST MEDICAL HISTORY :  He  has a past medical history of GERD (gastroesophageal reflux disease) and GI bleed.  PAST SURGICAL HISTORY: He  has no past surgical history on file.  No Known Allergies  No current facility-administered medications on file prior to encounter.    No current outpatient prescriptions on file prior to encounter.    FAMILY HISTORY:  His has no family status information on file.    SOCIAL HISTORY: He    REVIEW OF SYSTEMS:  Unable to complete the patient due to altered mental status/mechanical ventilation  SUBJECTIVE:    VITAL SIGNS: BP (!) 76/55   Pulse 80   Resp 23   Ht 6' (1.829 m)   Wt 190 lb (86.2 kg)   SpO2 94%   BMI 25.77 kg/m   HEMODYNAMICS:    VENTILATOR SETTINGS: Vent Mode: PRVC FiO2 (%):  [40 %-100 %] 40 % Set Rate:  [20 bmp] 20 bmp Vt Set:  [784[620 mL] 620 mL PEEP:  [5 cmH20] 5 cmH20 Plateau Pressure:  [16 cmH20] 16 cmH20  INTAKE / OUTPUT: No intake/output data recorded.  PHYSICAL EXAMINATION: General:  Frail elderly male, appears agonal despite ventilator  Neuro:  Obtunded, no response to verbal stimuli HEENT:  ETT, mm coated with coffee ground material  Cardiovascular:  s1s2 rrr, no m/r/g Lungs:  Vent assisted breaths, lungs coarse  bilaterally  Abdomen:  Scaphoid, soft, bsx4 decreased  Musculoskeletal:  No acute deformities Skin:  Cool / dry, dried emesis on LU chest  LABS:  BMET  Recent Labs Lab 05-06-2016 0717 05-06-2016 0731  NA 144 146*  K 3.4* 3.4*  CL 110 109  CO2 15*  --   BUN 31* 37*  CREATININE 1.83* 1.40*  GLUCOSE 378* 366*    Electrolytes  Recent Labs Lab 05-06-2016 0717  CALCIUM 8.7*    CBC  Recent Labs Lab 05-06-2016 0717 05-06-2016 0731  WBC 12.8*  --   HGB 10.3* 11.2*  HCT 33.4* 33.0*  PLT 174  --     Coag's  Recent Labs Lab 05-06-2016 0717  INR 1.16    Sepsis  Markers  Recent Labs Lab 05-06-2016 0732  LATICACIDVEN 10.45*    ABG  Recent Labs Lab 05-06-2016 0748  PHART 7.216*  PCO2ART 41.9  PO2ART 424.0*    Liver Enzymes  Recent Labs Lab 05-06-2016 0717  AST 175*  ALT PENDING  ALKPHOS 125  BILITOT 0.3  ALBUMIN 2.5*    Cardiac Enzymes No results for input(s): TROPONINI, PROBNP in the last 168 hours.  Glucose No results for input(s): GLUCAP in the last 168 hours.  Imaging Dg Chest Portable 1 View  Result Date: Apr 23, 2016 CLINICAL DATA:  Cardiopulmonary resuscitation.  Intubation. EXAM: PORTABLE CHEST 1 VIEW COMPARISON:  None. FINDINGS: Heart size is normal. There is aortic atherosclerosis. Endotracheal tube has its tip 3 cm above the carina. Nasogastric tube enters the abdomen. Lungs well aerated. There is mild diffuse interstitial edema. No pneumothorax or rib fracture seen post resuscitation. IMPRESSION: Endotracheal tube and nasogastric tube well positioned. Mild interstitial edema. Aortic atherosclerosis. Electronically Signed   By: Paulina FusiMark  Shogry M.D.   On: Apr 23, 2016 07:48     STUDIES:    CULTURES: Tracheal aspirate 11/12 >>  UA 11/12 >>   ANTIBIOTICS: Zosyn 11/12 >.   SIGNIFICANT EVENTS: 11/12  Admit post PEA arrest, GIB  LINES/TUBES: ETT 11/12 >>   DISCUSSION: Frail 82 year old male with a past history of dementia, DM, prior GI bleed and recent admission for UTI requiring ventilation admitted 11/12 with suspected GI bleed and PEA arrest area  ASSESSMENT / PLAN:  PULMONARY A: Acute Respiratory Failure - in the setting of suspected GIB, PEA arrest Possible Aspiration - found altered with emesis on chest Former Smoker P:   PRVC 8 cc/kg  Increase rate to 20 Trend CXR  See ID  CARDIOVASCULAR A:  PEA Arrest - required 15 minutes CPR/3 rounds Epi prior to ROSC Hypotension - suspect related to volume loss with possible GI bleed Atrial Fibrillation - ? New onset P:  NS bolus x1L now Trend troponin DO  NOT RESUSCITATE in the event of arrest No vasopressors Patient is not a candidate for cooling with concerns for active bleeding / GIB Comfort measures if patient fails medical management Tele monitoring > rate controlled   RENAL A:   AKI AGMA / Lactic Acidosis Hypokalemia P:   Trend BMP / UOP Place electrolytes as indicated Avoid nephrotoxic agents Trend lactate  GASTROINTESTINAL A:   Suspected GI bleed P:   PPI NPO Monitor GI output Consider GI evaluation if patient stabilizes  HEMATOLOGIC A:   Anemia - suspect blood loss P:  Q6 CBC SCDs for DVT prophylaxis  INFECTIOUS A:   R/O Aspiration  P:   Monitor CXR  Zosyn as above   ENDOCRINE A:   DM    P:  SSI   NEUROLOGIC A:   Acute Encephalopathy - in setting of cardiac arrest  P:   RASS goal: 0  PRN fentanyl for pain  Minimize sedation to allow for neuro exam if able    FAMILY  - Updates: Granddaughter and great-grandson updated extensively at bedside on patient's current status. Discussed plan of care to include current medical management without escalation in the event of decline.  - Inter-disciplinary family meet or Palliative Care meeting due by: 11/19   CC Time - 35 minutes   Canary Brim, NP-C [place] [facility] Pgr: [phone] or if no answer [phone] 08/20/2016, 9:36 AM

## 2016-07-25 ENCOUNTER — Inpatient Hospital Stay (HOSPITAL_COMMUNITY): Payer: Medicare HMO

## 2016-07-25 DIAGNOSIS — G931 Anoxic brain damage, not elsewhere classified: Secondary | ICD-10-CM

## 2016-07-25 LAB — CBC
HEMATOCRIT: 33.3 % — AB (ref 39.0–52.0)
HEMATOCRIT: 35.2 % — AB (ref 39.0–52.0)
HEMOGLOBIN: 10.8 g/dL — AB (ref 13.0–17.0)
HEMOGLOBIN: 11.3 g/dL — AB (ref 13.0–17.0)
MCH: 28.3 pg (ref 26.0–34.0)
MCH: 28.3 pg (ref 26.0–34.0)
MCHC: 32.4 g/dL (ref 30.0–36.0)
MCHC: 32.1 g/dL (ref 30.0–36.0)
MCV: 87.2 fL (ref 78.0–100.0)
MCV: 88 fL (ref 78.0–100.0)
Platelets: 203 10*3/uL (ref 150–400)
Platelets: 178 10*3/uL (ref 150–400)
RBC: 3.82 MIL/uL — ABNORMAL LOW (ref 4.22–5.81)
RBC: 4 MIL/uL — ABNORMAL LOW (ref 4.22–5.81)
RDW: 17.5 % — ABNORMAL HIGH (ref 11.5–15.5)
RDW: 17.7 % — ABNORMAL HIGH (ref 11.5–15.5)
WBC: 12.1 10*3/uL — ABNORMAL HIGH (ref 4.0–10.5)
WBC: 13.4 10*3/uL — AB (ref 4.0–10.5)

## 2016-07-25 LAB — RAPID URINE DRUG SCREEN, HOSP PERFORMED
Amphetamines: NOT DETECTED
BARBITURATES: NOT DETECTED
BENZODIAZEPINES: NOT DETECTED
COCAINE: NOT DETECTED
Opiates: NOT DETECTED
Tetrahydrocannabinol: NOT DETECTED

## 2016-07-25 LAB — BASIC METABOLIC PANEL
Anion gap: 11 (ref 5–15)
BUN: 53 mg/dL — ABNORMAL HIGH (ref 6–20)
CHLORIDE: 114 mmol/L — AB (ref 101–111)
CO2: 21 mmol/L — AB (ref 22–32)
CREATININE: 2.77 mg/dL — AB (ref 0.61–1.24)
Calcium: 8.4 mg/dL — ABNORMAL LOW (ref 8.9–10.3)
GFR calc non Af Amer: 19 mL/min — ABNORMAL LOW (ref >60)
GFR, EST AFRICAN AMERICAN: 21 mL/min — AB (ref >60)
GLUCOSE: 128 mg/dL — AB (ref 65–99)
Potassium: 4.5 mmol/L (ref 3.5–5.1)
Sodium: 146 mmol/L — ABNORMAL HIGH (ref 135–145)

## 2016-07-25 LAB — URINALYSIS, ROUTINE W REFLEX MICROSCOPIC
Bilirubin Urine: NEGATIVE
Glucose, UA: 100 mg/dL — AB
KETONES UR: NEGATIVE mg/dL
NITRITE: NEGATIVE
PH: 5 (ref 5.0–8.0)
Protein, ur: 100 mg/dL — AB
Specific Gravity, Urine: 1.019 (ref 1.005–1.030)

## 2016-07-25 LAB — URINE MICROSCOPIC-ADD ON

## 2016-07-25 LAB — PHOSPHORUS: PHOSPHORUS: 3.2 mg/dL (ref 2.5–4.6)

## 2016-07-25 LAB — GLUCOSE, CAPILLARY
GLUCOSE-CAPILLARY: 149 mg/dL — AB (ref 65–99)
Glucose-Capillary: 122 mg/dL — ABNORMAL HIGH (ref 65–99)

## 2016-07-25 LAB — MAGNESIUM: Magnesium: 1.9 mg/dL (ref 1.7–2.4)

## 2016-07-25 MED ORDER — MAGNESIUM SULFATE IN D5W 1-5 GM/100ML-% IV SOLN
1.0000 g | Freq: Once | INTRAVENOUS | Status: DC
  Filled 2016-07-25: qty 100

## 2016-07-25 MED ORDER — MORPHINE BOLUS VIA INFUSION
5.0000 mg | INTRAVENOUS | Status: DC | PRN
  Filled 2016-07-25: qty 20

## 2016-07-25 MED ORDER — SODIUM CHLORIDE 0.9 % IV SOLN
10.0000 mg/h | INTRAVENOUS | Status: DC
  Filled 2016-07-25: qty 10

## 2016-07-25 MED ORDER — MIDAZOLAM BOLUS VIA INFUSION (WITHDRAWAL LIFE SUSTAINING TX)
5.0000 mg | INTRAVENOUS | Status: DC | PRN
  Filled 2016-07-25: qty 20

## 2016-07-25 MED ORDER — MIDAZOLAM HCL 5 MG/ML IJ SOLN
10.0000 mg/h | INTRAMUSCULAR | Status: DC
  Administered 2016-07-25: 10 mg/h via INTRAVENOUS
  Filled 2016-07-25: qty 10

## 2016-07-25 MED ORDER — PIPERACILLIN-TAZOBACTAM IN DEX 2-0.25 GM/50ML IV SOLN
2.2500 g | Freq: Four times a day (QID) | INTRAVENOUS | Status: DC
  Administered 2016-07-25 (×2): 2.25 g via INTRAVENOUS
  Filled 2016-07-25 (×4): qty 50

## 2016-07-25 MED ORDER — SODIUM CHLORIDE 0.9 % IV SOLN
1.0000 mg/h | INTRAVENOUS | Status: DC
  Administered 2016-07-25: 2 mg/h via INTRAVENOUS
  Filled 2016-07-25: qty 10

## 2016-07-26 NOTE — Progress Notes (Signed)
   08/04/2016 2055  Clinical Encounter Type  Visited With Family;Health care provider  Visit Type Spiritual support;Critical Care;Death;Patient actively dying  Referral From Nurse  Spiritual Encounters  Spiritual Needs Sacred text;Prayer;Emotional;Grief support   Chaplain responded to a page for Spiritual Support as pt was extubated and given comfort care.  Chaplain offered empathetic listening, emotional support, prayer and hospitality to the family and stayed with the family for a long period as they were grieving.  Chaplain is available for further support if desired.  [employer] Rhiannon Sassaman, [address]

## 2016-07-27 ENCOUNTER — Encounter: Payer: Medicare HMO | Admitting: Internal Medicine

## 2016-07-28 ENCOUNTER — Telehealth: Payer: Self-pay

## 2016-07-28 NOTE — Telephone Encounter (Signed)
On 07/28/2016 I received a [facility] ([facility] The death certificate is for cremation. The patient is a patient of Doctor Byrum. [facility].  IOn 08/01/2016 I received the death certificate back from Doctor Byrum. I got the death certificate ready and called the funeral home to let them know the death certificate is ready for pickup.

## 2016-08-12 NOTE — Progress Notes (Signed)
Sommer MD notified of family wishes to begin comfort care measures. MD states will add Versed gtt for pt comfort and place other orders. Will continue to monitor.

## 2016-08-12 NOTE — Progress Notes (Signed)
PULMONARY / CRITICAL CARE MEDICINE   Name: Francisco Roberts MRN: [id number] DOB: 1922-02-01    ADMISSION DATE:  2016-03-16 CONSULTATION DATE:  01/12/16  REFERRING MD:  Dr. Rubin PayorPickering   CHIEF COMPLAINT:  PEA Arrest, GIB   HISTORY OF PRESENT ILLNESS:  82 y/o M, former smoker, with a past medical history of glaucoma s/p surgery on left eye, dementia, GERD, prior GI bleed (01/2016) & recent admission from 10/19-10/25 for UTI with associated AKI & ventilator dependent respiratory failure who presented to [facility] on 11/12 with concerns for GI bleed and PEA arrest.  The granddaughter is the patient's primary caretaker and she provides history. She reports last week he reached over and took her hand and told her that he was "going to die soon".  The day prior to admission he was in his usual state of health -ambulatory at home, able to feed himself, conversive but confused.  She put him to bed around 10 PM. Early a.m. on 11/12 her husband noted the patient had vomited on himself and appeared altered (~6am).  At that time he indicated his stomach hurt. EMS was activated and found him to have coffee-ground emesis as well as stool incontinence. He also was unresponsive with an agonal rhythm (PEA).  CPR was performed for proximally 15 minutes with 3 rounds of epinephrine, the patient was also intubated.  Initial ER evaluation found the patient to be obtunded on ventilator.  ABG 7.216 / 41 / 424 / 18.3.  Initial labs-NA 144, K 3.4, sr cr 1.83, glucose 378, anion gap 19, AST 175, ALT 124, ammonia 69, troponin 0.03, lactic acid 10.45, WBC 12.8, Hgb 10.3 and platelets 174. INR 1.16. Ethyl alcohol less than 5. Chest x-ray demonstrated mild interstitial edema. He was also noted to be hypothermic with core temp of 32.  EKG showed sinus tachycardia with right bundle branch block which was noted to be present on prior EKGs. Initially he was noted to be hemodynamically stable. However, later in his ER course his blood  pressure dropped into the 70s systolic requiring normal saline bolus.  PCCM consulted for ICU admission 11/12.  SUBJECTIVE / Interval events:  Pt has remained obtunded  EEG performed and shows severe cerebral dysfunction, no active seizures.   VITAL SIGNS: BP (!) 162/99 (BP Location: Right Arm)   Pulse (!) 108   Temp 97.2 F (36.2 C) (Core (Comment))   Resp (!) 23   Ht 6\' 1"  (1.854 m)   Wt 83.6 kg (184 lb 4.9 oz)   SpO2 97%   BMI 24.32 kg/m   HEMODYNAMICS:    VENTILATOR SETTINGS: Vent Mode: PSV;CPAP FiO2 (%):  [40 %-50 %] 40 % Set Rate:  [20 bmp] 20 bmp Vt Set:  [621[620 mL] 620 mL PEEP:  [5 cmH20] 5 cmH20 Pressure Support:  [10 cmH20] 10 cmH20 Plateau Pressure:  [20 cmH20-24 cmH20] 21 cmH20  INTAKE / OUTPUT: I/O last 3 completed shifts: In: 2990 [I.V.:1710; Other:10; NG/GT:120; IV Piggyback:1150] Out: 385 [Urine:60; Emesis/NG output:325]  PHYSICAL EXAMINATION: General:  Frail elderly male, appears agonal despite ventilator  Neuro:  Obtunded, no response to verbal stimuli. Serial myoclonic jerking noted HEENT:  ETT, OP clear Cardiovascular:  s1s2 rrr, no m/r/g Lungs:  Vent assisted breaths, lungs coarse bilaterally  Abdomen:  Scaphoid, soft, bsx4 decreased  Musculoskeletal:  No acute deformities Skin:  Cool / dry, dried emesis on LU chest  LABS:  BMET  Recent Labs Lab 01/12/16 0717 01/12/16 0731 08/08/2016 0629  NA 144  146* 146*  K 3.4* 3.4* 4.5  CL 110 109 114*  CO2 15*  --  21*  BUN 31* 37* 53*  CREATININE 1.83* 1.40* 2.77*  GLUCOSE 378* 366* 128*    Electrolytes  Recent Labs Lab 2016/02/06 0717 08/05/2016 0629  CALCIUM 8.7* 8.4*  MG  --  1.9  PHOS  --  3.2    CBC  Recent Labs Lab 2016/02/06 1958 07/30/2016 0140 07/16/2016 0629  WBC 7.0 12.1* 13.4*  HGB 11.2* 10.8* 11.3*  HCT 34.9* 33.3* 35.2*  PLT 198 203 178    Coag's  Recent Labs Lab 2016/02/06 0717  INR 1.16    Sepsis Markers  Recent Labs Lab 2016/02/06 0732 2016/02/06 0909  2016/02/06 1209  LATICACIDVEN 10.45* 7.7* 6.6*    ABG  Recent Labs Lab 2016/02/06 0748  PHART 7.216*  PCO2ART 41.9  PO2ART 424.0*    Liver Enzymes  Recent Labs Lab 2016/02/06 0717  AST 175*  ALT 124*  ALKPHOS 125  BILITOT 0.3  ALBUMIN 2.5*    Cardiac Enzymes  Recent Labs Lab 2016/02/06 0909 2016/02/06 1345 2016/02/06 1958  TROPONINI 0.05* 0.10* 0.21*    Glucose  Recent Labs Lab 2016/02/06 1142 2016/02/06 1620 2016/02/06 1958 2016/02/06 2339 08/09/2016 0421 08/05/2016 0808  GLUCAP 284* 219* 146* 94 149* 122*    Imaging Dg Chest Port 1 View  Result Date: 07/16/2016 CLINICAL DATA:  Acute respiratory failure. GI bleeding and cardiac arrest. Hypoxic brain injury. EXAM: PORTABLE CHEST 1 VIEW COMPARISON:  Yesterday FINDINGS: Emphysema and airway thickening and hyperinflation. Asymmetric opacity at the left base. When accounting for overlapping artifact, no convincing pneumothorax or pleural effusion. Normal heart size and aortic contours. Endotracheal tube tip at the mid to lower thoracic trachea. An orogastric tube tip is at the gastric fundus. IMPRESSION: 1. Unremarkable positioning of endotracheal and orogastric tubes. 2. Progressed opacity at the left base concerning for aspiration pneumonia or pneumonitis. Electronically Signed   By: Marnee SpringJonathon  Watts M.D.   On: 08/10/2016 07:02    STUDIES:   CULTURES: Tracheal aspirate 11/12 >>  UA 11/12 >>   ANTIBIOTICS: Zosyn 11/12 >   SIGNIFICANT EVENTS: 11/12  Admit post PEA arrest, GIB  LINES/TUBES: ETT 11/12 >>   DISCUSSION: Frail 82 year old male with a past history of dementia, DM, prior GI bleed and recent admission for UTI requiring ventilation admitted 11/12 with suspected GI bleed and PEA arrest area  ASSESSMENT / PLAN:  PULMONARY A: Acute Respiratory Failure - in the setting of suspected GIB, PEA arrest, and due to encephalopathy Possible Aspiration - found altered with emesis on chest Former Smoker P:   Continue  current MV pending family discussions regarding timing of withdrawal MV  CARDIOVASCULAR A:  PEA Arrest - required 15 minutes CPR/3 rounds Epi prior to ROSC Hypotension - suspect related to volume loss with possible GI bleed Atrial Fibrillation - ? New onset P:  Trend troponin DO NOT RESUSCITATE in the event of arrest No vasopressors Patient was not a candidate for cooling with concerns for active bleeding / GIB  RENAL A:   Acute renal failure / ATN AGMA / Lactic Acidosis Hypokalemia P:   Avoid nephrotoxic agents  GASTROINTESTINAL A:   Suspected GI bleed P:   PPI NPO Monitor GI output Defer GI evaluation given neurological injury and prognosis   HEMATOLOGIC A:   Anemia - suspect blood loss P:  D/c Q6 CBC SCDs for DVT prophylaxis  INFECTIOUS A:   R/O Aspiration  P:   Defer  further CXR  Zosyn as above, plan to d/c once family ready to transition to comfort   ENDOCRINE A:   DM    P:   Stop SSI protocol  NEUROLOGIC A:   Acute Encephalopathy - in setting of cardiac arrest  P:   RASS goal: 0  PRN fentanyl for pain  Minimize sedation to allow for neuro exam if able    FAMILY  - Updates: Spoke with pt's granddaughter and primary caregiver at bedside this AM 11/13. Reviewed EEG results with her and pt's grandson by phone. Discussed poor neuro prognosis. Pt will likely not wake up. They are appropriately saddened but understand that he cannot recover. We discussed transition to comfort and withdrawal of MV. She will gather the rest of his family and then let us know when they are ready to withdraw.   - Inter-disciplinary family meet or Palliative Care meeting due by: done 11/13   Independent CC time 40 minutes   Levy Pupa, MD, PhD 07/21/2016, 10:03 AM [facility] [phone] or if no answer [phone]

## 2016-08-12 NOTE — Progress Notes (Signed)
180 cc Morphine wasted in sink, 15 cc Versed WIS, Witnessed by Franki CabotBrianna Holley, RN.

## 2016-08-12 NOTE — Procedures (Signed)
Extubation Procedure Note  Patient Details:   Name: Francisco Roberts DOB: 1922-01-20 MRN: [id number]   Airway Documentation:     Evaluation  O2 sats: stable throughout Complications: No apparent complications Patient did not tolerate procedure well. Bilateral Breath Sounds: Rhonchi, Diminished   No  Gala RomneyMackey, Lexii Walsh M 2016/07/11, 10:15 PM

## 2016-08-12 NOTE — Progress Notes (Signed)
eLink Physician-Brief Progress Note Patient Name: Francisco Roberts DOB: Feb 09, 1922 MRN: [id number]   Date of Service  07/22/2016  HPI/Events of Note  Spoke with patient's granddaughter, Francisco Roberts, who relates that the family is in agreement given the patient's severe anoxic brain injury and the extremely poor prognosis for neurological recovery that the patient be made comfort measures only, extubated and allowed to pass with comfort and dignity.   eICU Interventions  Will make patient comfort measures. Extubate and initiate morphine and versed IV infusions per the withdrawal of life sustaining treatment protocol.      Intervention Category Minor Interventions: Communication with other healthcare providers and/or family  Lenell AntuSommer,Advit Trethewey Eugene 07/21/2016, 9:02 PM

## 2016-08-12 NOTE — Progress Notes (Signed)
eLink Physician-Brief Progress Note Patient Name: Francisco Roberts DOB: 1922/02/23 MRN: [id number]   Date of Service  04-30-2016  HPI/Events of Note  Transient AFIB with RVR. Now spontaneously converted to NSR. K+ = 4.5 and Mg++ = 1.9.  eICU Interventions  Continue present management for now.      Intervention Category Intermediate Interventions: Arrhythmia - evaluation and management  Anastazja Isaac Eugene 04-30-2016, 8:01 PM

## 2016-08-12 NOTE — Progress Notes (Signed)
Pharmacy Antibiotic Note  Francisco Roberts is a 82 y.o. male s/p PEA. Pharmacy has been consulted for Zosyn dosing for suspected aspiration PNA.He is also noted with generalized myoclonus and overall poor prognosis.  -WBC 13.4, afeb. LA = 10.45>>6.6 -SCr= 2.77 (up), CrCl ~20  Plan: -Change zosyn to 2.25gm IV q6h -Will follow renal function and clinical progress   Height: 6\' 1"  (185.4 cm) Weight: 184 lb 4.9 oz (83.6 kg) IBW/kg (Calculated) : 79.9  Temp (24hrs), Avg:96.6 F (35.9 C), Min:91.8 F (33.2 C), Max:99.9 F (37.7 C)   Recent Labs Lab 08/01/2016 0717 07/20/2016 0731 08/04/2016 0732 07/29/2016 0909 07/18/2016 1209 07/21/2016 1958 28-Mar-2016 0140 28-Mar-2016 0629  WBC 12.8*  --   --   --  7.7 7.0 12.1* 13.4*  CREATININE 1.83* 1.40*  --   --   --   --   --  2.77*  LATICACIDVEN  --   --  10.45* 7.7* 6.6*  --   --   --     Estimated Creatinine Clearance: 19.6 mL/min (by C-G formula based on SCr of 2.77 mg/dL (H)).    No Known Allergies  Antimicrobials this admission: Zosyn 11/12 >>   Microbiology results: MRSA PCR- neg  Thank you for allowing pharmacy to be a part of this patient's care.  Harland GermanAndrew Brigid Vandekamp, Pharm D 2016/01/15 8:22 AM

## 2016-08-12 NOTE — Progress Notes (Signed)
Time of Death 2308. Two nurse verification Charlynn CourtBen Dasani Crear Rn, Franki CabotBrianna Holley RN.

## 2016-08-12 DEATH — deceased

## 2016-09-12 NOTE — Discharge Summary (Signed)
PULMONARY / CRITICAL CARE MEDICINE Death Summary   Name: Francisco Roberts MRN: [id number] DOB: [birth year]    ADMISSION DATE:  05/06/16 DATE OF DEATH: 08/05/2016   Final cause of death:  Anoxic encephalopathy  Secondary causes of death:  PEA arrest, cardiac arrest Shock, presumed hemorrhagic shock Acute upper GI bleeding Acute respiratory failure Suspected aspiration pneumonitis/pneumonia Acute renal failure, ATN Lactic acidosis Atrial fibrillation Hypokalemia Acute blood loss anemia Diabetes mellitus GERD Hx Glaucoma    REFERRING MD:  Dr. Rubin PayorPickering   CHIEF COMPLAINT:  PEA Arrest, GIB   HISTORY OF PRESENT ILLNESS:  83 y/o M, former smoker, with a past medical history of glaucoma s/p surgery on left eye, dementia, GERD, prior GI bleed (01/2016) & recent admission from 10/19-10/25 for UTI with associated AKI & ventilator dependent respiratory failure who presented to [facility] on 11/12 with concerns for GI bleed and PEA arrest.  The granddaughter is the patient's primary caretaker and she provides history. She reports last week he reached over and took her hand and told her that he was "going to die soon".  The day prior to admission he was in his usual state of health -ambulatory at home, able to feed himself, conversive but confused.  She put him to bed around 10 PM. Early a.m. on 11/12 her husband noted the patient had vomited on himself and appeared altered (~6am).  At that time he indicated his stomach hurt. EMS was activated and found him to have coffee-ground emesis as well as stool incontinence. He also was unresponsive with an agonal rhythm (PEA).  CPR was performed for proximally 15 minutes with 3 rounds of epinephrine, the patient was also intubated.  Initial ER evaluation found the patient to be obtunded on ventilator.  ABG 7.216 / 41 / 424 / 18.3.  Initial labs-NA 144, K 3.4, sr cr 1.83, glucose 378, anion gap 19, AST 175, ALT 124, ammonia 69, troponin 0.03, lactic  acid 10.45, WBC 12.8, Hgb 10.3 and platelets 174. INR 1.16. Ethyl alcohol less than 5. Chest x-ray demonstrated mild interstitial edema. He was also noted to be hypothermic with core temp of 32.  EKG showed sinus tachycardia with right bundle branch block which was noted to be present on prior EKGs. Initially he was noted to be hemodynamically stable. However, later in his ER course he developed shock. Volume resuscitation and pressors were initiated. He was treated empirically with Zosyn for possible aspiration pneumonia. His exam showed evidence for mild clonus without any other responsiveness. Based on his evidence for severe injury on neurologic exam a GI workup was deferred. Discussions were undertaken with the patient's family and they were informed that there was very little chance for meaningful neurological recovery. Based on this information the decision was made to transition him to comfort care and to withdraw mechanical ventilation. This was done on 07/19/2016 and the patient expired that evening.   Levy Pupaobert Byrum, MD, PhD 08/22/2016, 2:45 AM [facility] [phone] or if no answer [phone]

## 2017-09-01 IMAGING — CR DG ABDOMEN 2V
4 series · 4 of 4 positions shown · non-contrast
Comparison: 05/25/1999 [DATE]

CLINICAL DATA: Altered mental status, vomiting

EXAM:
ABDOMEN - 2 VIEW

[x abdomen supine (1 of 2)]
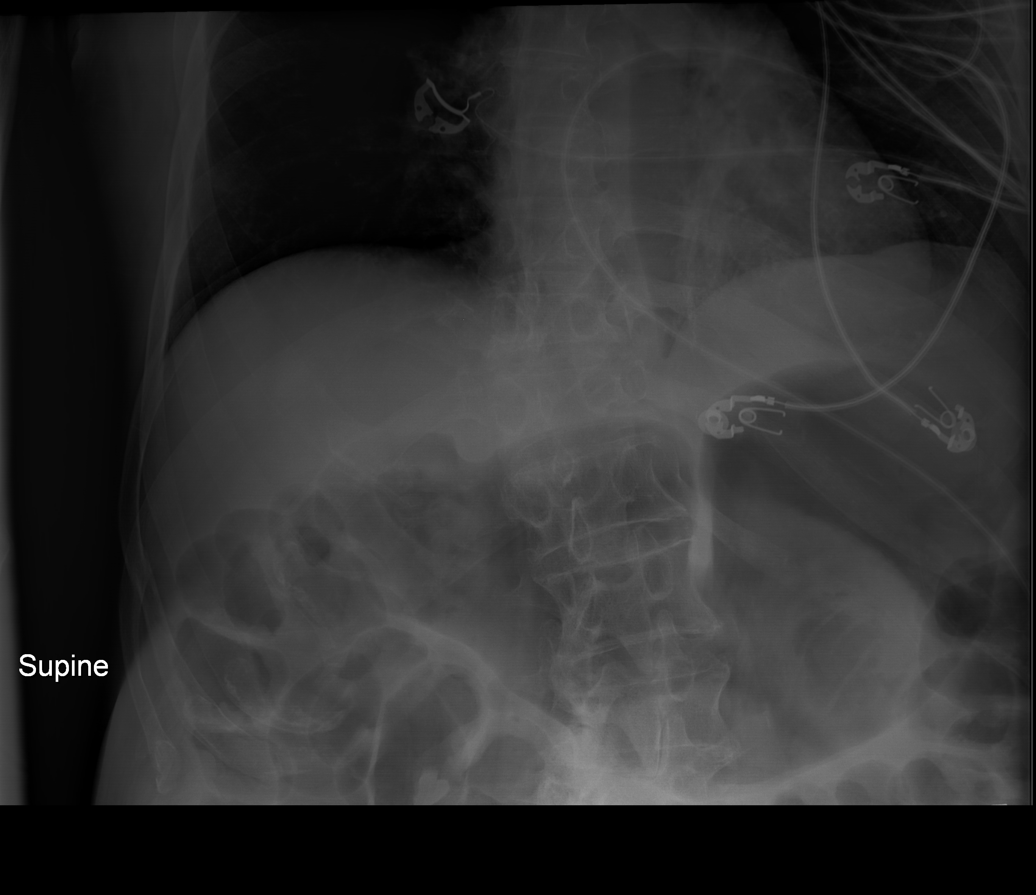

[x abdomen supine (2 of 2)]
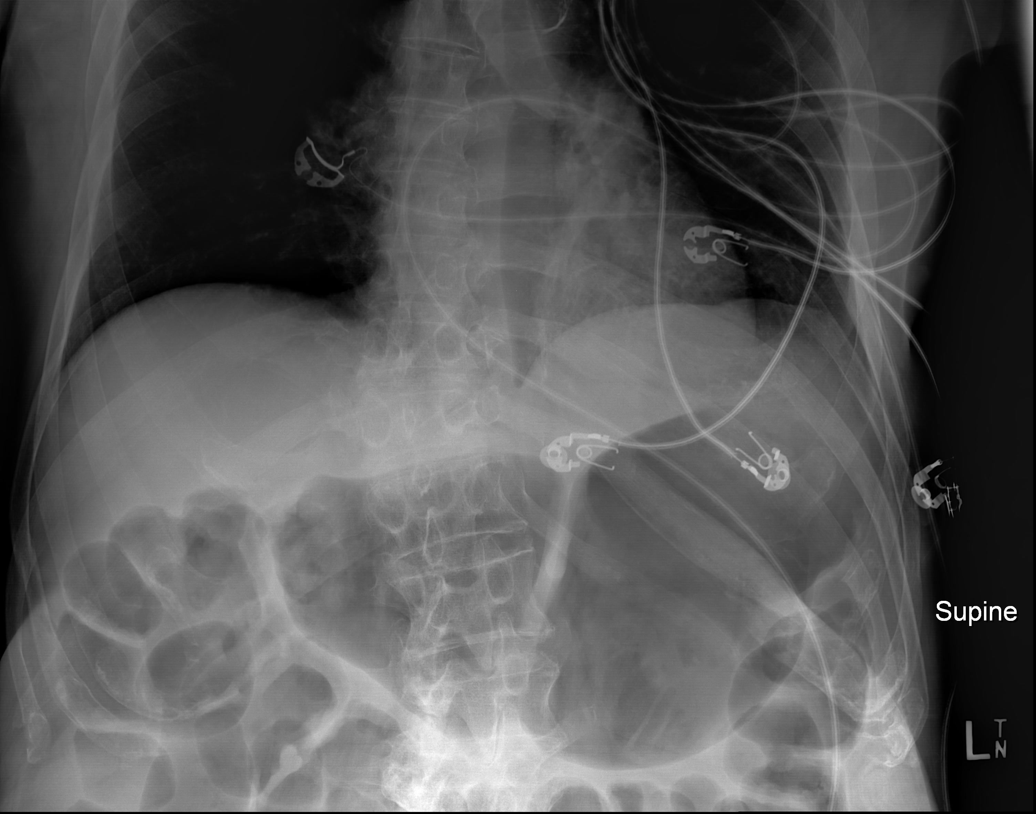

[w abdomen decub (1 of 2)]
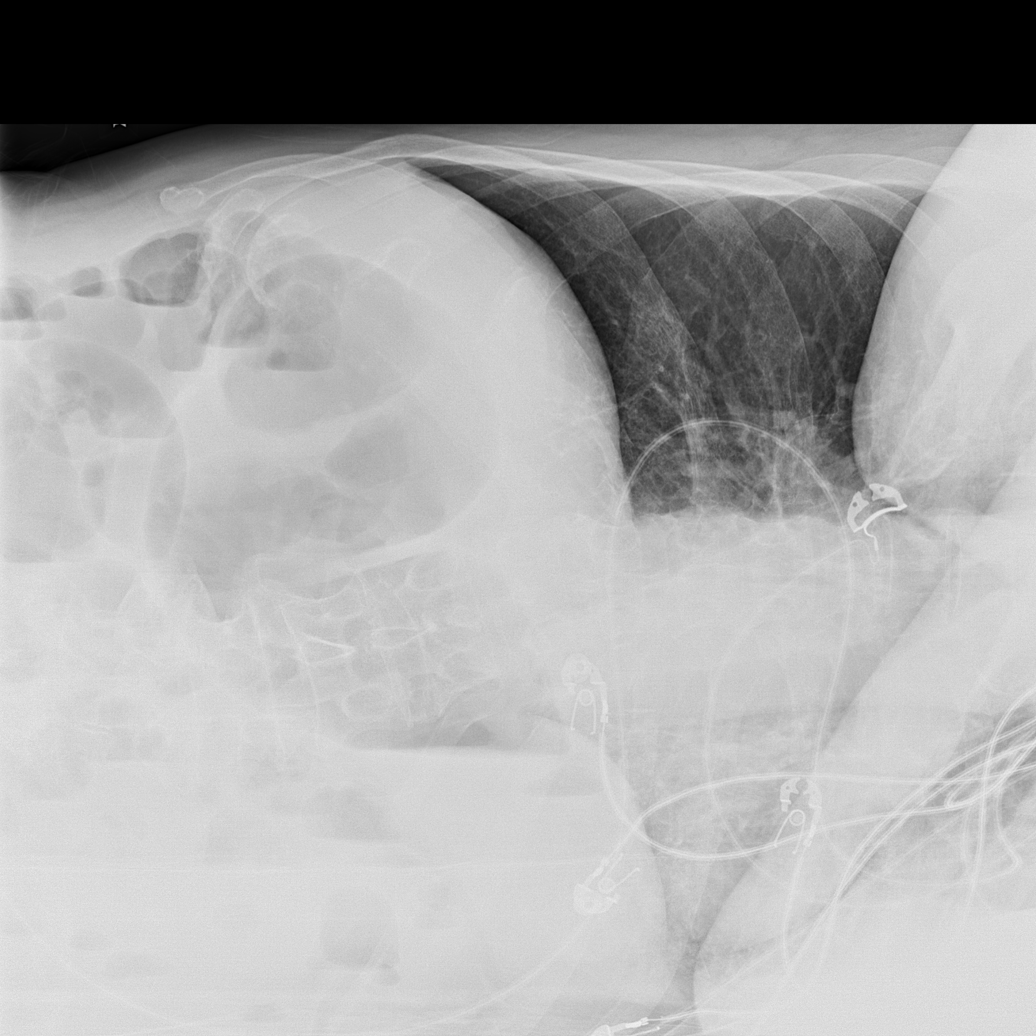

[w abdomen decub (2 of 2)]
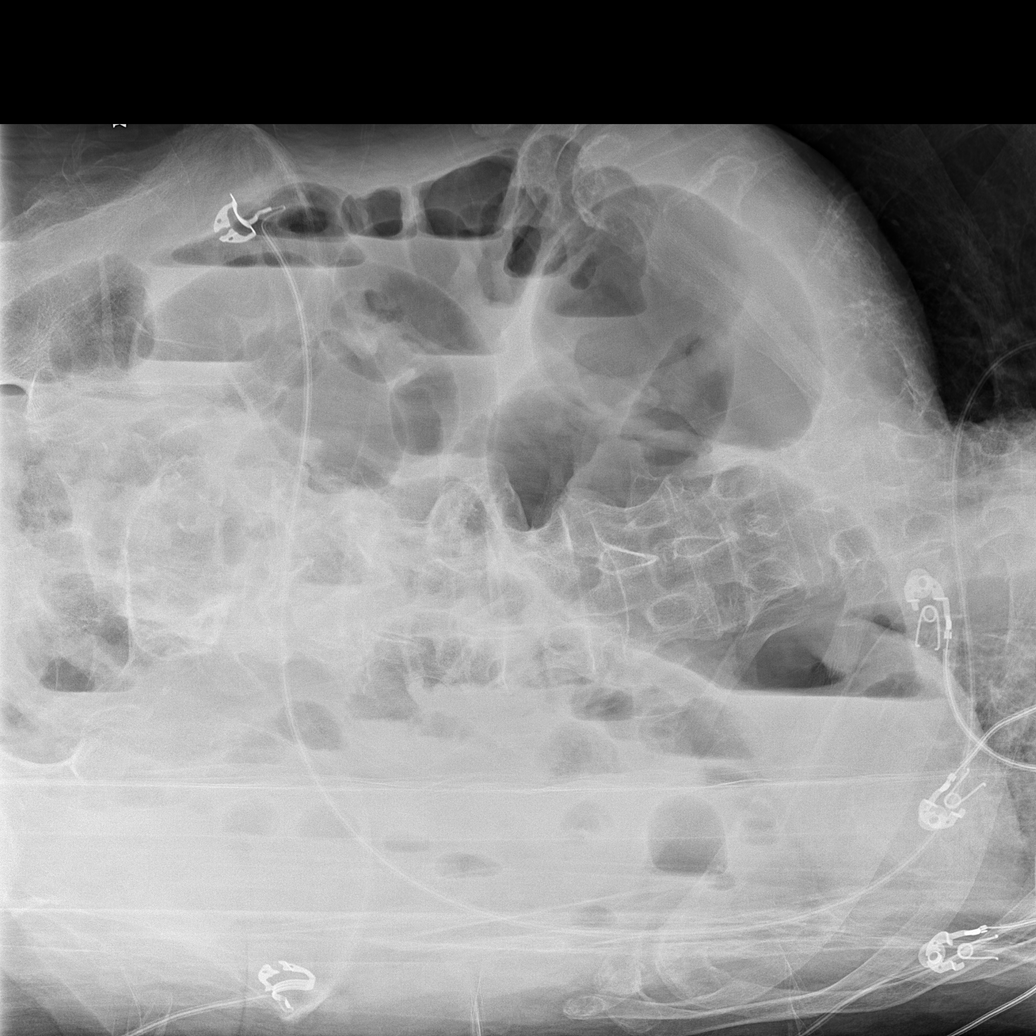

[4 of 4 positions shown; findings below may reference images not displayed]

FINDINGS: Nonobstructive bowel gas pattern.

No evidence of free air on the lateral decubitus view.

Mild degenerative changes of the lumbar spine.
IMPRESSION: No evidence of small bowel obstruction or free air.
